# Patient Record
Sex: Female | Born: 1980 | Race: Black or African American | Hispanic: No | Marital: Single | Smoking: Never smoker
Health system: Southern US, Community
[De-identification: ages and names within clinical notes are randomized; demographics above are authoritative.]

## PROBLEM LIST (undated history)

## (undated) DIAGNOSIS — R7303 Prediabetes: Secondary | ICD-10-CM

## (undated) DIAGNOSIS — F319 Bipolar disorder, unspecified: Secondary | ICD-10-CM

## (undated) DIAGNOSIS — I10 Essential (primary) hypertension: Secondary | ICD-10-CM

## (undated) DIAGNOSIS — E119 Type 2 diabetes mellitus without complications: Secondary | ICD-10-CM

## (undated) DIAGNOSIS — F209 Schizophrenia, unspecified: Secondary | ICD-10-CM

---

## 2005-05-09 ENCOUNTER — Inpatient Hospital Stay (HOSPITAL_COMMUNITY): Admission: AD | Admit: 2005-05-09 | Discharge: 2005-05-14 | Payer: Self-pay | Admitting: *Deleted

## 2005-05-09 ENCOUNTER — Ambulatory Visit: Payer: Self-pay | Admitting: Obstetrics and Gynecology

## 2005-05-23 ENCOUNTER — Ambulatory Visit: Payer: Self-pay | Admitting: *Deleted

## 2005-05-30 ENCOUNTER — Ambulatory Visit: Payer: Self-pay | Admitting: *Deleted

## 2005-06-07 ENCOUNTER — Ambulatory Visit: Payer: Self-pay | Admitting: Obstetrics & Gynecology

## 2005-06-08 ENCOUNTER — Ambulatory Visit: Payer: Self-pay | Admitting: Obstetrics & Gynecology

## 2005-06-14 ENCOUNTER — Ambulatory Visit: Payer: Self-pay | Admitting: Obstetrics & Gynecology

## 2005-06-21 ENCOUNTER — Ambulatory Visit: Payer: Self-pay | Admitting: *Deleted

## 2005-06-21 ENCOUNTER — Ambulatory Visit (HOSPITAL_COMMUNITY): Admission: RE | Admit: 2005-06-21 | Discharge: 2005-06-21 | Payer: Self-pay | Admitting: *Deleted

## 2005-07-05 ENCOUNTER — Ambulatory Visit: Payer: Self-pay | Admitting: Obstetrics & Gynecology

## 2005-07-19 ENCOUNTER — Ambulatory Visit: Payer: Self-pay | Admitting: Obstetrics & Gynecology

## 2005-07-26 ENCOUNTER — Ambulatory Visit: Payer: Self-pay | Admitting: *Deleted

## 2005-07-28 ENCOUNTER — Ambulatory Visit: Payer: Self-pay | Admitting: *Deleted

## 2005-08-02 ENCOUNTER — Ambulatory Visit (HOSPITAL_COMMUNITY): Admission: RE | Admit: 2005-08-02 | Discharge: 2005-08-02 | Payer: Self-pay | Admitting: *Deleted

## 2005-08-02 ENCOUNTER — Ambulatory Visit: Payer: Self-pay | Admitting: Obstetrics & Gynecology

## 2005-08-04 ENCOUNTER — Ambulatory Visit: Payer: Self-pay | Admitting: *Deleted

## 2005-08-09 ENCOUNTER — Ambulatory Visit: Payer: Self-pay | Admitting: *Deleted

## 2005-08-09 ENCOUNTER — Ambulatory Visit (HOSPITAL_COMMUNITY): Admission: RE | Admit: 2005-08-09 | Discharge: 2005-08-09 | Payer: Self-pay | Admitting: *Deleted

## 2005-08-14 ENCOUNTER — Ambulatory Visit: Payer: Self-pay | Admitting: *Deleted

## 2005-08-14 ENCOUNTER — Inpatient Hospital Stay (HOSPITAL_COMMUNITY): Admission: AD | Admit: 2005-08-14 | Discharge: 2005-08-18 | Payer: Self-pay | Admitting: Obstetrics & Gynecology

## 2005-08-15 ENCOUNTER — Encounter (INDEPENDENT_AMBULATORY_CARE_PROVIDER_SITE_OTHER): Payer: Self-pay | Admitting: Specialist

## 2006-06-25 IMAGING — US US FETAL BPP W/O NONSTRESS
1 series · 14 of 17 positions shown · non-contrast
Comparison: none

CLINICAL DATA: BPP, assigned gestational age is 39 weeks 3 days.

[Series 1: us fetal bpp w/o nonstress · 0.39mm/px · 17 acquisitions, 14 frames shown]
[im 1/17]
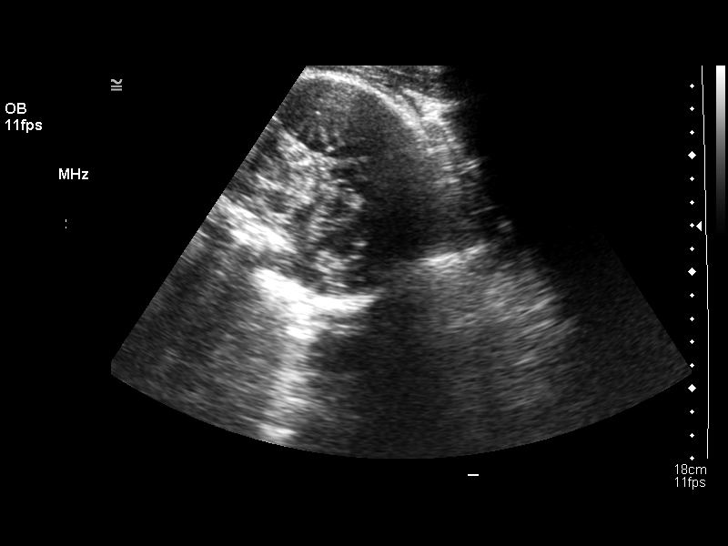
[im 2/17]
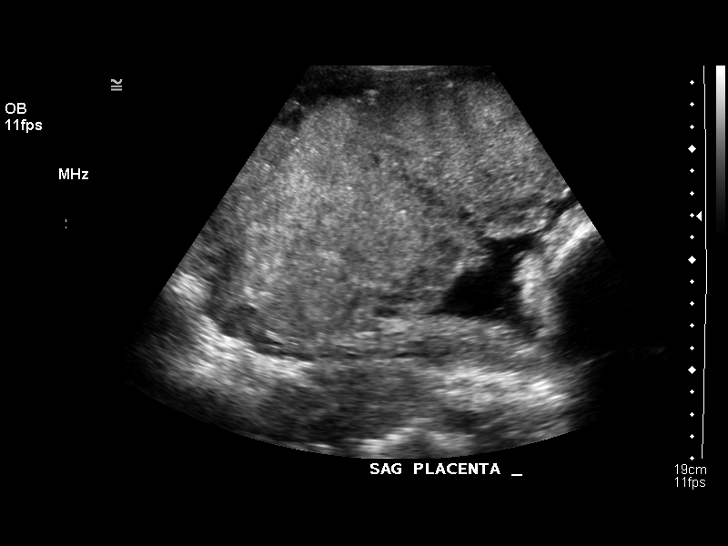
[im 4/17]
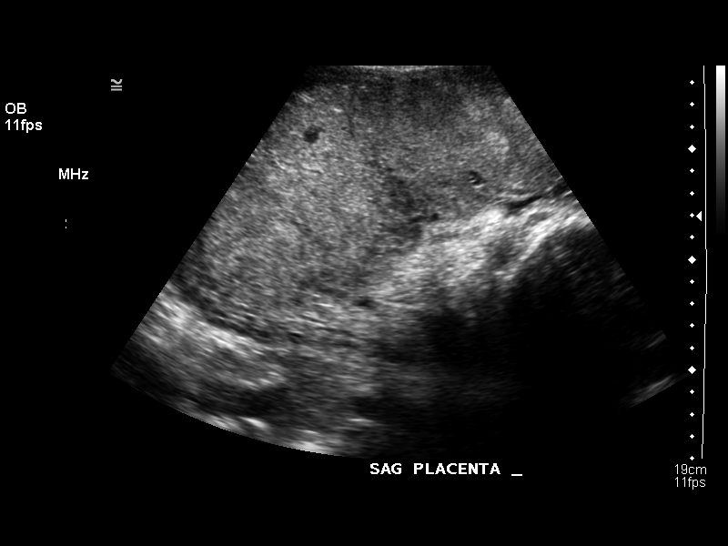
[im 5/17]
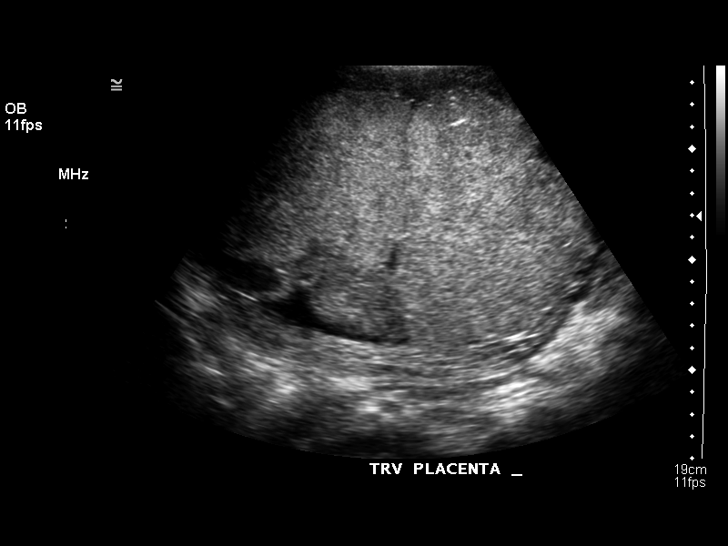
[im 6/17]
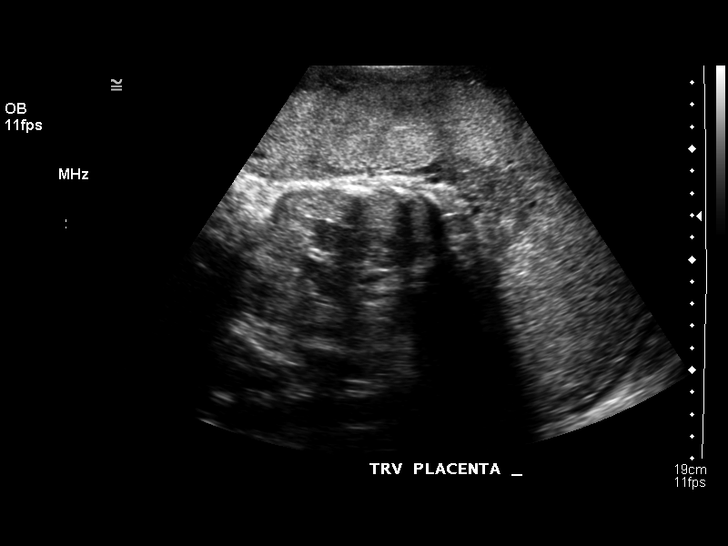
[im 7/17]
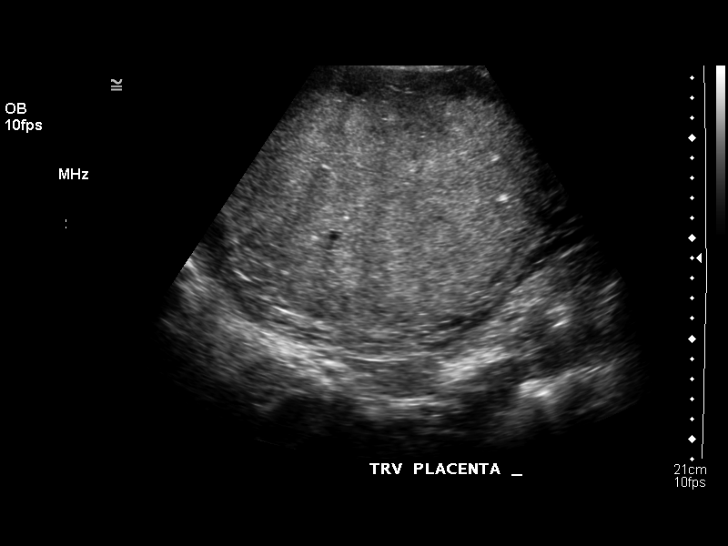
[im 8/17]
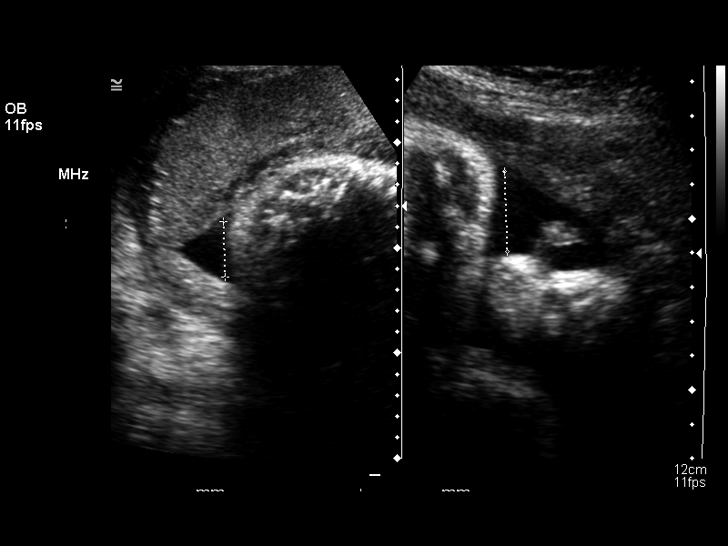
[im 10/17]
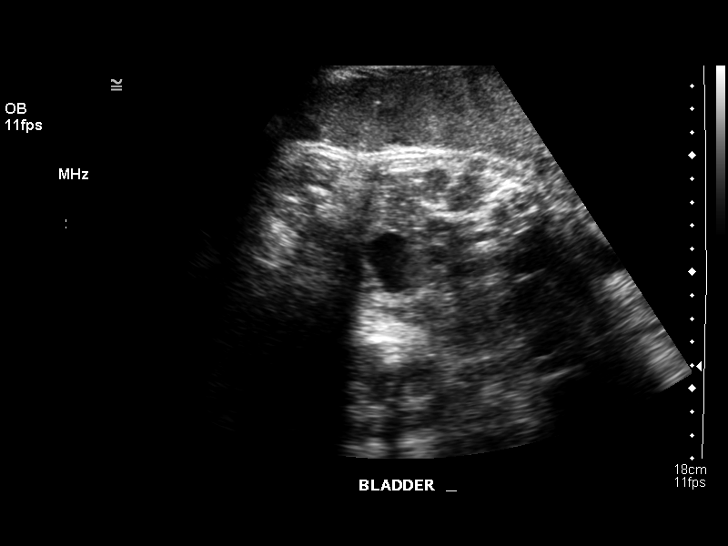
[im 11/17]
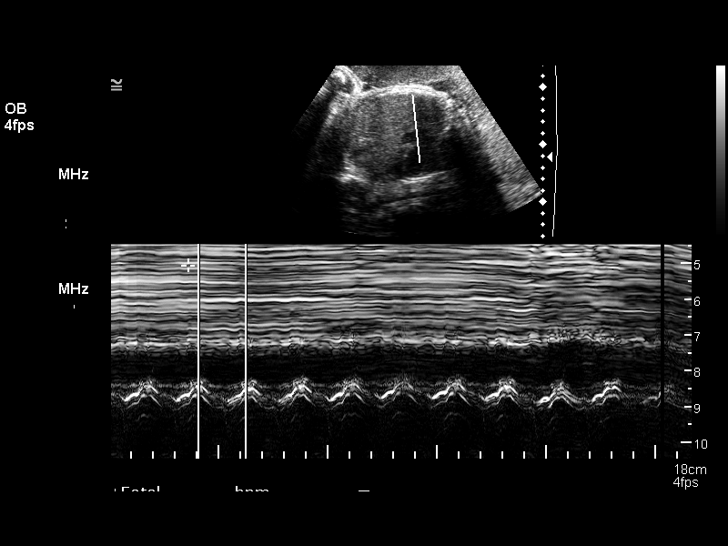
[im 12/17]
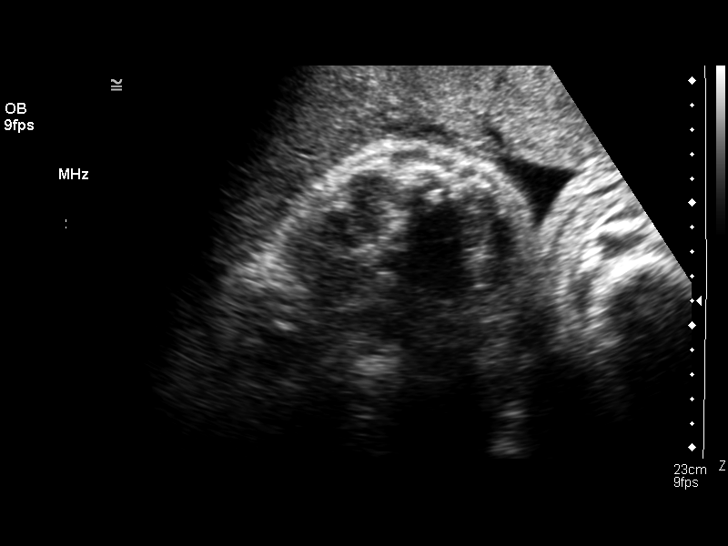
[im 13/17]
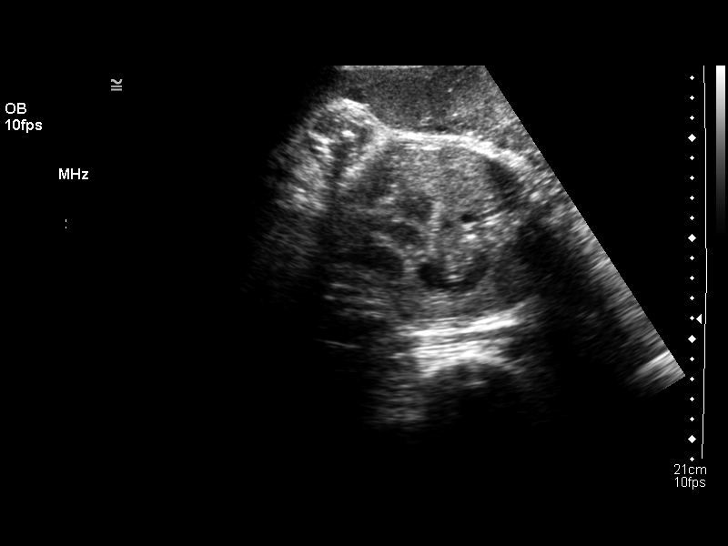
[im 14/17]
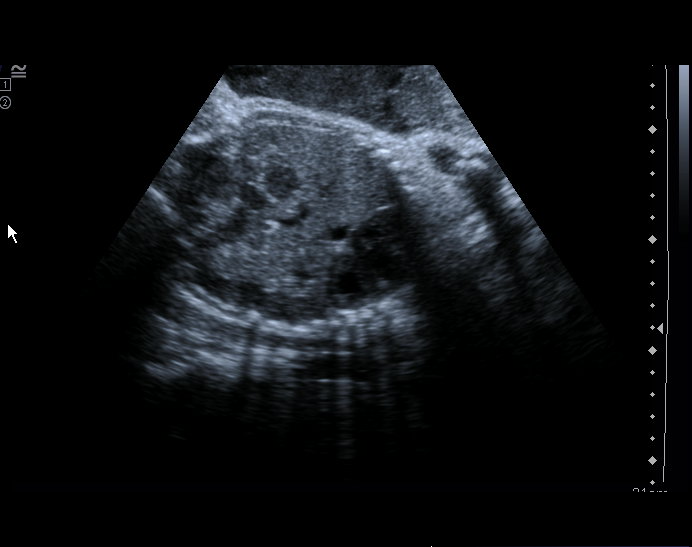
[im 16/17]
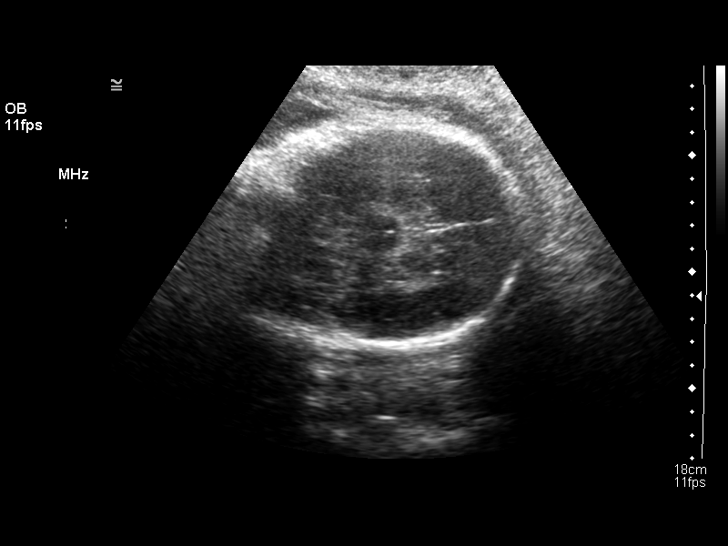
[im 17/17]
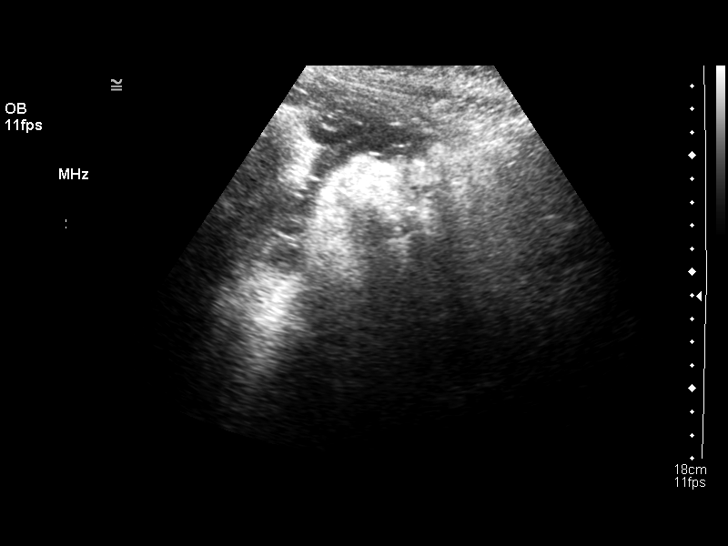

[14 of 17 positions shown; findings below may reference images not displayed]

BIOPHYSICAL PROFILE

 Number of Fetuses:  1
 Heart rate:  139
 Movement:  Yes
 Breathing:  Yes
 Presentation:  Cephalic
 Placental Location:  Anterior
 Grade:  II
 Previa:  No
 Amniotic Fluid (Subjective):  Normal
 Amniotic Fluid (Objective):  12.9 cm AFI (5th -95th%ile = 7.2 ? 22.6 cm for 39 wks)

 Fetal measurements and complete anatomic evaluation were not requested.  The following fetal anatomy was visualized on this exam:  Lateral ventricles, stomach, kidneys and bladder.  

 BPP SCORING
 Movements:  2  Time:  15 minutes
 Breathing:  2
 Tone:  2
 Amniotic Fluid:  2
 Total Score:  8

 MATERNAL UTERINE AND ADNEXAL FINDINGS
 Cervix:  Not evaluated
IMPRESSION: The biophysical score is [DATE] over a 15 minute period.  There is a single living intrauterine gestation in cephalic presentation.

## 2009-12-23 ENCOUNTER — Ambulatory Visit: Payer: Self-pay | Admitting: Internal Medicine

## 2011-04-21 NOTE — Discharge Summary (Signed)
Kristen [name], CRAVEY NO.:  [id number]   MEDICAL RECORD NO.:  [id number]          PATIENT TYPE:  INP   LOCATION:  9149                          [facility]ACILITY:  [facility]   PHYSICIAN:  Conni Elliot, M.D.DATE OF BIRTH:  05-23-1981   DATE OF ADMISSION:  05/09/2005  DATE OF DISCHARGE:  05/14/2005                                 DISCHARGE SUMMARY   ADMISSION DIAGNOSES:  1.  Intrauterine pregnancy 26-5/7 weeks.  2.  ___________ urinary tract infection.  3.  Viral illness.   DISCHARGE DIAGNOSES:  Probable viral illness at 27-4/7 weeks.   HISTORY:  This is a 30 year old, primigravida presenting at 26-5/7 weeks  based on LMP and confirmatory ultrasound.  The patient presented with fever  and chills of one day duration as well as a headache.  She had no  contractions. Membranes intact.  No vaginal bleeding, good fetal activity.  She did complain of a cottage cheese-like vaginal discharge with occasional  itching.  She had no dysuria or other urinary symptoms other than suprapubic  discomfort for a couple of weeks.  She did have a loose cough for two days.  No shortness of breath or chest pain.  Also family members had URI.  She had  a mild headache and had vomited once.  She had had no prenatal care and no  known pregnancy complications.  She had taken Tylenol prior to arrival and  had no allergies.   OB HISTORY:  Gravida 1.   GYN HISTORY:  History of Chlamydia in 2000.   PAST MEDICAL HISTORY:  Noncontributory.   PAST SURGICAL HISTORY:  None.   FAMILY HISTORY:  Positive for diabetes and hypertension.   SOCIAL HISTORY:  Negative tobacco, alcohol, or drugs.   PRENATAL LABS:  Obtained during hospitalization.  Hemoglobin 11.3 and stable  at the time of discharge.  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  HIV negative.  Hepatitis B negative.  Urine drug screen was negative.  Urinalysis was significant for trace  leukocyte esterase,  two epithelials and many bacteria.  Urine culture  subsequently came back negative as well as blood cultures came back  negative.  Vaginal GBS was positive.  GC negative.  Chlamydia negative.  RPR  nonreactive.  Rubella immune.  She had an ANA due to platelet count of 94  which may have been spurious as the repeat value was over 200.  She had an  ultrasound which showed symmetric growth, grade 2 placenta, breech  presentation, normal amniotic fluid, and cervix measuring 3.9.  Normal  anatomy.   ADMISSION PHYSICAL EXAMINATION:  GENERAL:  The patient looked ill and was  coughing.  VITAL SIGNS: Temperature 99.6, pulse 125, blood pressure 140/68.  Repeat  temperature was 101.8.  HEART:  RRR.  LUNGS:  CTA.  ABDOMEN:  Soft, nontender, consistent with 26-week size.  Negative CVA  tenderness.  EXTREMITIES:  Without edema.  HEENT:  Throat was clear.  CERVIX:  On speculum exam, there was scant white discharge.  On digital  exam, mid to anterior, long, closed, high.  Fetal heart  rate was in the  170's and nonreactive initially. Moderate variability.   Urinalysis was noted above.  White count was 13.4 with a left shift.  Wet  prep was negative.   HOSPITAL COURSE:  The patient was admitted for further evaluation and was  given oxygen mask and Tylenol p.r.n. as well as an IV fluid bolus and was  begun on Rocephin for possible ascending UTI.  She continued to have some  fever spikes and in fact remained febrile up to about 48 hours.  She also  had a cough which precipitated having a TB skin done which the patient  declined.  Zithromax was added to her antibiotic regimen.  Chest x-ray was  done which was negative.  PPD was done which was later negative.  Blood  cultures as above negative.  On hospital day #5, she had no complaints and  her cough was improved. She had been afebrile for 24 hours.  At this time  her platelets were noted to be 94,000 and she had an ANA done which was  subsequently  positive.  However, the repeat platelet count was well within  normal limits.  She was felt to have a resolving viral illness with her  condition improved and was discharged home on Zithromax. She was to take her  temperature at home and follow up in clinic in one week.  She was given a  prescription for prenatal vitamins one a day.  She was to take two more days  of Zithromax 500 mg p.o.  In addition, the patient declined one-hour Glucola  which would be done at clinic on the following week.      Kristen [name], C.N.M.    ______________________________  Conni Elliot, M.D.    DP/MEDQ  D:  07/18/2005  T:  07/19/2005  Job:  [id number]

## 2011-04-21 NOTE — Op Note (Signed)
NAMESYRENA, BURGES              ACCOUNT NO.:  [id number]   MEDICAL RECORD NO.:  [id number]          PATIENT TYPE:  INP   LOCATION:  9114                          [facility]ILITY:  [facility]   PHYSICIAN:  Conni Elliot, M.D.DATE OF BIRTH:  06-Jan-1981   DATE OF PROCEDURE:  08/15/2005  DATE OF DISCHARGE:                                 OPERATIVE REPORT   PREOPERATIVE DIAGNOSIS:  Repetitive severe variable fetal heart rate  decelerations following spontaneous rupture of membranes and probable occult  cord prolapse.   POSTOPERATIVE DIAGNOSIS:  Repetitive severe variable fetal heart rate  decelerations following spontaneous rupture of membranes and probable occult  cord prolapse.   PROCEDURE:  Low transverse cesarean section in urgent fashion.   SURGEON:  Conni Elliot, M.D.   ANESTHESIA:  Spinal.   FINDINGS:  Cord gas pH 7.01, pCO2 80.   ESTIMATED BLOOD LOSS:  800 mL.   DESCRIPTION OF PROCEDURE:  After placing the patient under spinal  anesthetic, the patient was supine with left lateral tilt position.  Abdomen  was prepped in the sterile fashion.  A low transverse incision was made  through the skin, subcutaneous tissue, and fascia.  The rectus muscle  separated in the midline.  The bladder flap created.  A low transverse  uterine incision was made and the baby was delivered in the vertex  presentation.  There was fresh meconium.  The baby was DeLee suctioned prior  to delivery of the chest.  The cord was doubly clamped and cut and the baby  handed to the neonatologist in attendance.  The placenta was delivered  spontaneously.  The uterus, bladder flap, anterior peritoneum, fascia,  subcutaneous tissue, and skin were closed in routine fashion.  Estimated  blood loss was approximately 800 mL.           ______________________________  Conni Elliot, M.D.     ASG/MEDQ  D:  08/15/2005  T:  08/16/2005  Job:  [id number]

## 2011-04-21 NOTE — Discharge Summary (Signed)
NAMETRENISHA, LAFAVOR              ACCOUNT NO.:  [id number]   MEDICAL RECORD NO.:  [id number]           PATIENT TYPE:   LOCATION:                                 FACILITY:   PHYSICIAN:  Mitsy Owen                DATE OF BIRTH:  [birth year]   DATE OF ADMISSION:  08/14/2005  DATE OF DISCHARGE:  08/18/2005                                 DISCHARGE SUMMARY   ADMISSION DIAGNOSES:  Intrauterine pregnancy at 40-1/7 weeks admitted for  induction of labor.   DISCHARGE DIAGNOSES:  1.  Status post lower transverse cesarean section.  2.  A 6 pound 10 ounce viable female.   DISCHARGE MEDICATIONS:  1.  Percocet 5/325 p.o. q.4h. as needed for pain.  2.  Prenatal vitamins.  3.  Iron sulfate.   BRIEF HISTORY AND PHYSICAL:  Patient is a 30 year old gravida 1 at 40-1/[redacted]  weeks gestation who presented for induction of labor secondary to  questionable chronic hypertension with concern for pregnancy induced  hypertension.   HOSPITAL COURSE:  Patient was admitted to labor and delivery.  PIH  laboratories were drawn which were all within normal limits.  Induction was  started with Cervidil.  Penicillin was started for the patient's GBS  positive status.  During the induction fetal heart rate began showing severe  repetitive variable decelerations following spontaneous rupture of  membranes.  As a result, patient was taken for a cesarean section which was  done on September 12.  There was felt to be probable occult cord prolapse.  Delivery during cesarean section was uncomplicated.  Apgars were 7 at one  minute and 8 at five minutes.  Cord gas pH was 7.01.  Postoperative course  was uncomplicated.  Blood type A+, rubella immune.  Postoperative hemoglobin  on September 13 was 12.  Mom is bottle feeding.  A Depo shot was given prior  to discharge for contraception.  Staples were removed prior to discharge.   CONDITION ON DISCHARGE:  Stable.   DISCHARGE INSTRUCTIONS:  Patient is to follow up at [facility] in six  weeks.  Patient was instructed to avoid heavy lifting for the next couple of  weeks.  She is also advised nothing per vagina for six weeks.      Benn Moulder, M.D.    ______________________________  Larina Lieurance   MR/MEDQ  D:  08/18/2005  T:  08/18/2005  Job:  [id number]

## 2011-08-14 ENCOUNTER — Emergency Department (HOSPITAL_COMMUNITY)
Admission: EM | Admit: 2011-08-14 | Discharge: 2011-08-16 | Disposition: A | Discharge status: Other Acute Inpatient Hospital | Payer: Self-pay | Attending: Emergency Medicine | Admitting: Emergency Medicine

## 2011-08-14 DIAGNOSIS — F411 Generalized anxiety disorder: Secondary | ICD-10-CM | POA: Insufficient documentation

## 2011-08-14 DIAGNOSIS — IMO0002 Reserved for concepts with insufficient information to code with codable children: Secondary | ICD-10-CM | POA: Insufficient documentation

## 2011-08-14 DIAGNOSIS — I498 Other specified cardiac arrhythmias: Secondary | ICD-10-CM | POA: Insufficient documentation

## 2011-08-14 DIAGNOSIS — F29 Unspecified psychosis not due to a substance or known physiological condition: Secondary | ICD-10-CM | POA: Insufficient documentation

## 2011-08-14 DIAGNOSIS — I10 Essential (primary) hypertension: Secondary | ICD-10-CM | POA: Insufficient documentation

## 2011-08-14 LAB — CBC
MCH: 26.1 pg (ref 26.0–34.0)
Platelets: 328 10*3/uL (ref 150–400)
RBC: 4.36 MIL/uL (ref 3.87–5.11)
RDW: 13.6 % (ref 11.5–15.5)

## 2011-08-14 LAB — ACETAMINOPHEN LEVEL: Acetaminophen (Tylenol), Serum: <15 ug/mL (ref 10–30)

## 2011-08-14 LAB — DIFFERENTIAL
Eosinophils Absolute: 0 10*3/uL (ref 0.0–0.7)
Eosinophils Relative: 0 % (ref 0–5)
Monocytes Absolute: 0.7 10*3/uL (ref 0.1–1.0)
Monocytes Relative: 7 % (ref 3–12)
Neutrophils Relative %: 73 % (ref 43–77)

## 2011-08-14 LAB — COMPREHENSIVE METABOLIC PANEL
ALT: 22 U/L (ref 0–35)
BUN: 11 mg/dL (ref 6–23)
CO2: 22 mEq/L (ref 19–32)
Chloride: 100 mEq/L (ref 96–112)
Creatinine, Ser: 0.83 mg/dL (ref 0.50–1.10)
GFR calc Af Amer: >60 mL/min (ref >60)
GFR calc non Af Amer: >60 mL/min (ref >60)
Sodium: 135 mEq/L (ref 135–145)
Total Bilirubin: 0.9 mg/dL (ref 0.3–1.2)

## 2011-08-15 ENCOUNTER — Emergency Department (HOSPITAL_COMMUNITY): Payer: Self-pay

## 2011-08-15 LAB — POCT I-STAT TROPONIN I: Troponin i, poc: 0.02 ng/mL (ref 0.00–0.08)

## 2011-08-15 LAB — POCT PREGNANCY, URINE: Preg Test, Ur: NEGATIVE

## 2011-08-15 LAB — URINALYSIS, ROUTINE W REFLEX MICROSCOPIC
Glucose, UA: NEGATIVE mg/dL
Ketones, ur: 15 mg/dL — AB
Leukocytes, UA: NEGATIVE
Urobilinogen, UA: 0.2 mg/dL (ref 0.0–1.0)

## 2011-08-15 LAB — URINE MICROSCOPIC-ADD ON

## 2011-08-15 LAB — BASIC METABOLIC PANEL
BUN: 6 mg/dL (ref 6–23)
Calcium: 9.1 mg/dL (ref 8.4–10.5)
Chloride: 106 mEq/L (ref 96–112)
Creatinine, Ser: 0.55 mg/dL (ref 0.50–1.10)
Potassium: 3.7 mEq/L (ref 3.5–5.1)
Sodium: 139 mEq/L (ref 135–145)

## 2011-08-15 LAB — RAPID URINE DRUG SCREEN, HOSP PERFORMED
Amphetamines: NOT DETECTED
Barbiturates: NOT DETECTED
Benzodiazepines: NOT DETECTED
Opiates: NOT DETECTED

## 2011-08-16 ENCOUNTER — Inpatient Hospital Stay (HOSPITAL_COMMUNITY)
Admission: AD | Admit: 2011-08-16 | Discharge: 2011-09-02 | DRG: 885 | Disposition: A | Discharge status: Home / Self Care | Payer: PRIVATE HEALTH INSURANCE | Attending: Psychiatry | Admitting: Psychiatry

## 2011-08-16 DIAGNOSIS — F329 Major depressive disorder, single episode, unspecified: Secondary | ICD-10-CM

## 2011-08-16 DIAGNOSIS — F411 Generalized anxiety disorder: Secondary | ICD-10-CM

## 2011-08-16 DIAGNOSIS — I498 Other specified cardiac arrhythmias: Secondary | ICD-10-CM

## 2011-08-16 DIAGNOSIS — Z79899 Other long term (current) drug therapy: Secondary | ICD-10-CM

## 2011-08-16 DIAGNOSIS — F3289 Other specified depressive episodes: Secondary | ICD-10-CM

## 2011-08-16 DIAGNOSIS — T43505A Adverse effect of unspecified antipsychotics and neuroleptics, initial encounter: Secondary | ICD-10-CM

## 2011-08-16 DIAGNOSIS — F333 Major depressive disorder, recurrent, severe with psychotic symptoms: Secondary | ICD-10-CM

## 2011-08-16 DIAGNOSIS — F29 Unspecified psychosis not due to a substance or known physiological condition: Principal | ICD-10-CM

## 2011-08-20 LAB — GLUCOSE, CAPILLARY: Glucose-Capillary: 106 mg/dL — ABNORMAL HIGH (ref 70–99)

## 2011-08-20 LAB — TSH: TSH: 1.588 u[IU]/mL (ref 0.350–4.500)

## 2011-08-21 LAB — GLUCOSE, CAPILLARY

## 2011-08-22 LAB — GLUCOSE, CAPILLARY: Glucose-Capillary: 106 mg/dL — ABNORMAL HIGH (ref 70–99)

## 2011-08-22 LAB — PREGNANCY, URINE: Preg Test, Ur: NEGATIVE

## 2011-08-23 LAB — GLUCOSE, CAPILLARY

## 2011-08-30 NOTE — Assessment & Plan Note (Signed)
[name], [name] NO.:  [id number]  MEDICAL RECORD NO.:  [id number]  LOCATION:  0404                          [facility]ILITY:  [facility]  PHYSICIAN:  Eulogio Ditch, MD DATE OF BIRTH:  1981/09/04  DATE OF ADMISSION:  08/15/2011 DATE OF DISCHARGE:                      PSYCHIATRIC ADMISSION ASSESSMENT   IDENTIFYING INFORMATION:  This is a 30 year old female who was admitted on an involuntary basis on August 15, 2011.  HISTORY OF PRESENT ILLNESS:  The patient presents on petition with papers stating the patient is disorganized, internally preoccupied, guarded and needing stabilization.  It also stated in her petition that the patient has been hallucinating for about a month, abandoned her 59- year-old child, ran into the street screaming, combative and fighting with police, complaining about hearing voices and refusing to answer questions, stating that she wanted to kill everyone and that she is "a spiritual retard."  When she was brought into the emergency room, the patient was complaining of coughing and shortness of breath.  PAST PSYCHIATRIC HISTORY:  [facility] No past or current psychiatric treatment.  SOCIAL HISTORY:  The patient is a 30 year old single female.  She is employed at [employer].  She has a 10-year-old child, and she lives in [place].  FAMILY HISTORY:  Unknown.  ALCOHOL AND DRUG HISTORY:  No apparent alcohol or substance use.  PRIMARY CARE PROVIDER:  Listed as none.  MEDICAL PROBLEMS:  The patient denies any acute or chronic health issues.  MEDICATIONS:  None.  DRUG ALLERGIES:  No known allergies.  PHYSICAL EXAM:  This is a normally-developed female who was assessed in the emergency department.  Her acetaminophen level is less than 15, salicylate level less than 2.  Her potassium is 3, blood sugar 149. Alcohol level less than 11.  Urine pregnancy test is negative.  Urine drug screen is  negative.  Urinalysis shows 0-2 WBCs.  Hemoglobin 11.4, hematocrit 33.  Her chest x-ray shows mild chronic peribronchial thickening.  Lungs otherwise grossly clear.  The patient was also reporting feeling afraid of the color blue.  She arrived in handcuffs, and the patient had to be placed in 4-point restraints.  Her physical exam was reviewed with no significant findings.  She was, however, agitated, anxious, argumentative and combative.  She received Geodon 20 mg and Ativan 3 for emergency sedation.  At one point in time, she became bradycardic and unresponsive and received atropine.  MENTAL STATUS EXAM:  The patient was in her room dressed in scrubs with poor eye contact.  Her speech is soft-spoken.  She offers little.  She seems very guarded but does answer brief questions coherently.  She is asking to go home.  Axis I:  Psychosis not otherwise specified. Axis II:  Deferred. Axis III:  No known medical conditions. Axis IV:  Deferred at this time. Axis V:  Current is 25-30.  PLAN:  We will have Haldol and Cogentin scheduled to lessen psychotic symptoms.  We will continue to gather more information and contact her family or support for concerns, identify her support group and continue to assess further comorbidities.  Her tentative length of stay at this time is 5-7 days or more.  Landry Corporal, N.P.   ______________________________ Eulogio Ditch, MD    JO/MEDQ  D:  08/16/2011  T:  08/16/2011  Job:  [id number]  Electronically Signed by Limmie PatriciaP. on 08/17/2011 09:27:35 AM Electronically Signed by Eulogio Ditch  on 08/30/2011 03:25:16 PM

## 2011-09-06 NOTE — Discharge Summary (Signed)
Kristen [name], HICKMON NO.:  [id number]  MEDICAL RECORD NO.:  [id number]  LOCATION:  0406                          [facility]ILITY[facility]  [facility]  PHYSICIAN:  Eulogio Ditch, MD DATE OF BIRTH:  10/21/1981  DATE OF ADMISSION:  08/16/2011 DATE OF DISCHARGE:  09/02/2011                              DISCHARGE SUMMARY   IDENTIFYING INFORMATION:  This is a 30 year old female.  This was an involuntary admission.  HISTORY OF PRESENT ILLNESS:  First [facility] admission for [facility], who presented in our emergency room accompanied by law enforcement who had found her in the middle of [employer].  She was uncooperative and combative at the time, and apparently had left her home and abandoned her 41-year-old child.  She was significantly agitated in the emergency room and unable to give a coherent history.  She repeatedly said that she hated the color blue.  She was treated with several doses of Ativan and ultimately Geodon 20 mg IM for stabilization and subsequently became bradycardic.  She did receive a cardiology consultation, was treated with oxygen and received atropine and stabilized without further incident.  She was transferred to [facility] for further evaluation and stabilization.  She has no previous history of treatment with psychotropics or previous psychiatric admissions and no evidence of substance abuse.  MEDICAL EVALUATION:  She was medically evaluated in our emergency room. Urine drug screen was negative for all substances.  CBC normal with a hemoglobin of 11.4, platelets of 328,000, alcohol screen negative. Salicylate screen negative.  Chemistry remarkable for a decreased potassium at 3.0, which was repleted with 10 mEq of IV potassium. Kidney function noted to be normal.  BUN 11, creatinine 0.83.  Liver enzymes mildly elevated.  She displayed no abnormal movements and displayed no focal neurologic findings.  COURSE OF HOSPITALIZATION:  She was admitted to  our acute stabilization unit and initially started on 2 mg of Haldol p.o. q.h.s. and Cogentin 1 mg daily.  She presented with guarded affect and manner, disorganized thinking and appeared internally preoccupied with a constricted affect. She was given a provisional diagnosis of psychosis NOS.  For the first week she displayed quite disorganized behavior with poor insight, appeared internally preoccupied, quite anxious, insisting that she wanted to leave.  Klonopin 1 mg q.h.s. was added to decrease anxiety. This alleviated her anxiety, but she continued to be quite guarded and suspicious, denying any suicidal thoughts or homicidal thoughts.  She was started on a multivitamin daily to address a very slight anemia. Haldol was increased to 1 mg q.a.m. and 5 mg q.h.s. to address issues with agitation, and she was started on Zoloft 50 mg q.a.m. for depression and Trileptal 300 mg q.a.m. and q.h.s. for mood stabilization.  She was restrained several times for aggressive behavior and attempts to elope from the unit.  We placed her in a private room due to being verbally aggressive with other patients.  By the 19th, her compliance with medications was spotty.  She had developed some thick-tongued speech and it was not clear if she was having EPS, so we elected to stop the Haldol, which at that point was at 10 mg q.h.s. and she was refusing  some doses.  We elected to start her on Prolixin 5 mg q.h.s., benztropine 1 mg q.h.s. and Cogentin 1 mg t.i.d. p.r.n. for any signs of dystonia.  We also elected to restrict her meals and activities to the unit due to her repeated efforts to elope.  She became very focused on the fact that she was pregnant, in spite of a negative pregnancy test, so a second pregnancy test was performed and it was also negative.  She was continuing to refuse medications and a second opinion to force medications was performed by Dr. Orson Aloe, who agreed that  forced medication was necessary.  Shalayna was then started on Prolixin 5 mg orally p.o. q.h.s. with an order to give 5 mg of Prolixin immediate release formulation at bedtime if she refused the p.o. dose.  She did require IM Prolixin several times, ultimately it was discontinued and she was switched to our Risperdal 2 mg p.o. q.a.m.  She was briefly on one-to-one observation, which was discontinued on September 25th.  By the 25th, she was able to participate productively in group therapy and for the first time do more than just insist on leaving.  She wanted to return to her apartment in [place], pack up and then relocate. She had talked about going to live with an aunt and other relatives in [place], and said that her daughter was also going to stay in [place] with relatives.  By the 27th, she demonstrated improved mood and judgment, expressed willingness to go to [facility] for medication management until she left the area and accepted information about follow-up programs.  Ultimately, she was returning to her own apartment and her cousin was willing to pick her up.  She has an aunt who lives locally who was on her way back from [place] who agreed to provide support.  By the 29th, she was in full contact with reality and stable for discharge.  She was taking medication as prescribed and was willing to continue it.  We gauged her suicide risk as minimal.  DISCHARGE/PLAN:  Follow up with [facility] on Tuesday, October 2nd at 9:00 a.m.  DISCHARGE DIAGNOSES:  Axis I:  Psychosis not otherwise specified. Axis II:  Deferred. Axis III:  History of bradycardia on IM Geodon, resolved. Axis IV:  Deferred. Axis V:  Current 52, past year not known.  Discharge Medications: Benztropine 1mg  q hs Clonazepam 1mg  q hs Risperidone 2mg  qam and qhs Sertraline 50mg  daily    Margaret A. Lorin Picket, N.P.   ______________________________ Eulogio Ditch,  MD    MAS/MEDQ  D:  09/04/2011  T:  09/04/2011  Job:  [id number]  Electronically Signed by Kari Baars N.P. on 09/05/2011 08:32:29 AM Electronically Signed by Eulogio Ditch  on 09/06/2011 12:23:58 PM

## 2012-12-25 ENCOUNTER — Encounter (HOSPITAL_COMMUNITY): Payer: Self-pay

## 2012-12-25 ENCOUNTER — Emergency Department (HOSPITAL_COMMUNITY)
Admission: EM | Admit: 2012-12-25 | Discharge: 2012-12-25 | Disposition: A | Discharge status: Home / Self Care | Payer: Medicaid Other | Attending: Emergency Medicine | Admitting: Emergency Medicine

## 2012-12-25 DIAGNOSIS — K029 Dental caries, unspecified: Secondary | ICD-10-CM | POA: Insufficient documentation

## 2012-12-25 DIAGNOSIS — Z79899 Other long term (current) drug therapy: Secondary | ICD-10-CM | POA: Insufficient documentation

## 2012-12-25 DIAGNOSIS — K0889 Other specified disorders of teeth and supporting structures: Secondary | ICD-10-CM

## 2012-12-25 DIAGNOSIS — K089 Disorder of teeth and supporting structures, unspecified: Secondary | ICD-10-CM | POA: Insufficient documentation

## 2012-12-25 MED ORDER — NAPROXEN 250 MG PO TABS: 250.0000 mg | ORAL_TABLET | Freq: Two times a day (BID) | ORAL | Status: DC

## 2012-12-25 MED ORDER — PENICILLIN V POTASSIUM 250 MG PO TABS: 250.0000 mg | ORAL_TABLET | Freq: Four times a day (QID) | ORAL | Status: DC

## 2012-12-25 MED ORDER — HYDROCODONE-ACETAMINOPHEN 5-325 MG PO TABS: ORAL_TABLET | ORAL | Status: DC

## 2012-12-25 MED ORDER — HYDROCODONE-ACETAMINOPHEN 5-325 MG PO TABS
2.0000 | ORAL_TABLET | Freq: Once | ORAL | Status: AC
  Administered 2012-12-25: 2 via ORAL
  Filled 2012-12-25: qty 2

## 2012-12-25 NOTE — ED Notes (Signed)
Lt. Lower toothache,. Mild swelling noted. Broke tooth.

## 2012-12-25 NOTE — ED Provider Notes (Signed)
History     CSN: [id number]  Arrival date & time 12/25/12  1549   First MD Initiated Contact with Patient 12/25/12 1756      Chief Complaint  Patient presents with  . Dental Pain     HPI Pt was seen at 1755.  Per pt, c/o gradual onset and persistence of constant left upper teeth "pain" for the past several days.  States she bit into some food and "felt a piece of tooth break."  Denies fevers, no intra-oral edema, no rash, no facial swelling, no dysphagia, no neck pain.   The condition is aggravated by nothing. The condition is relieved by nothing. The symptoms have been associated with no other complaints. The patient has no significant history of serious medical conditions.    History reviewed. No pertinent past medical history.  No past surgical history on file.   History  Substance Use Topics  . Smoking status: Never Smoker   . Smokeless tobacco: Not on file  . Alcohol Use: No      Review of Systems ROS: Statement: All systems negative except as marked or noted in the HPI; Constitutional: Negative for fever and chills. ; ; Eyes: Negative for eye pain and discharge. ; ; ENMT: Positive for dental caries, dental hygiene poor and toothache. Negative for ear pain, bleeding gums, dental injury, facial deformity, facial swelling, hoarseness, nasal congestion, sinus pressure, sore throat, throat swelling and tongue swollen. ; ; Cardiovascular: Negative for chest pain, palpitations, diaphoresis, dyspnea and peripheral edema. ; ; Respiratory: Negative for cough, wheezing and stridor. ; ; Gastrointestinal: Negative for nausea, vomiting, diarrhea and abdominal pain. ; ; Genitourinary: Negative for dysuria, flank pain and hematuria. ; ; Musculoskeletal: Negative for back pain and neck pain. ; ; Skin: Negative for rash and skin lesion. ; ; Neuro: Negative for headache, lightheadedness and neck stiffness. ;      Allergies  Review of patient's allergies indicates no known allergies.  Home  Medications   Current Outpatient Rx  Name  Route  Sig  Dispense  Refill  . ARIPIPRAZOLE 9.75 MG/1.3ML IM SOLN   Intramuscular   Inject 9.75 mg into the muscle every 30 (thirty) days.         . IBUPROFEN 200 MG PO TABS   Oral   Take 200 mg by mouth every 6 (six) hours as needed. For tooth pain         . LITHIUM CARBONATE 300 MG PO CAPS   Oral   Take 300 mg by mouth at bedtime.         Marland Kitchen MIRTAZAPINE 30 MG PO TABS   Oral   Take 30 mg by mouth at bedtime.         Marland Kitchen HYDROCODONE-ACETAMINOPHEN 5-325 MG PO TABS      1 or 2 tabs PO q6 hours prn pain   20 tablet   0   . NAPROXEN 250 MG PO TABS   Oral   Take 1 tablet (250 mg total) by mouth 2 (two) times daily with a meal.   14 tablet   0   . PENICILLIN V POTASSIUM 250 MG PO TABS   Oral   Take 1 tablet (250 mg total) by mouth 4 (four) times daily.   20 tablet   0     BP 156/84  Pulse 82  Temp 97.6 F (36.4 C)  Resp 18  SpO2 97%  LMP 12/18/2012  Physical Exam 1800: Physical examination: Vital signs and O2  SAT: Reviewed; Constitutional: Well developed, Well nourished, Well hydrated, In no acute distress; Head and Face: Normocephalic, Atraumatic; Eyes: EOMI, PERRL, No scleral icterus; ENMT: Mouth and pharynx normal, Poor dentition, Widespread dental decay, Left TM normal, Right TM normal, Mucous membranes moist, +upper left 1st and 2nd molars with dental decay.  No gingival erythema, edema, fluctuance, or drainage.  No intra-oral edema. No hoarse voice, no drooling, no stridor.  ; Neck: Supple, Full range of motion, No lymphadenopathy; Cardiovascular: Regular rate and rhythm, No murmur, rub, or gallop; Respiratory: Breath sounds clear & equal bilaterally, No rales, rhonchi, wheezes, or rub, Normal respiratory effort/excursion; Chest: Nontender, Movement normal; Extremities: Pulses normal, No tenderness, No edema; Neuro: AA&Ox3, Major CN grossly intact.  No gross focal motor or sensory deficits in extremities.; Skin: Color  normal, No rash, No petechiae, Warm, Dry    ED Course  Procedures     MDM  MDM Reviewed: nursing note and vitals     1805:  Pt encouraged to f/u with dentist or oral surgeon for her dental needs for good continuity of care and definitive treatment.  Verb understanding.         Laray Anger, DO 12/27/12 [phone]

## 2013-05-13 ENCOUNTER — Encounter (HOSPITAL_COMMUNITY): Payer: Self-pay

## 2013-05-13 ENCOUNTER — Emergency Department (HOSPITAL_COMMUNITY)
Admission: EM | Admit: 2013-05-13 | Discharge: 2013-05-13 | Disposition: A | Discharge status: Home / Self Care | Payer: Medicaid Other | Attending: Emergency Medicine | Admitting: Emergency Medicine

## 2013-05-13 DIAGNOSIS — L03012 Cellulitis of left finger: Secondary | ICD-10-CM

## 2013-05-13 DIAGNOSIS — L03019 Cellulitis of unspecified finger: Secondary | ICD-10-CM | POA: Insufficient documentation

## 2013-05-13 DIAGNOSIS — L0390 Cellulitis, unspecified: Secondary | ICD-10-CM

## 2013-05-13 DIAGNOSIS — M2550 Pain in unspecified joint: Secondary | ICD-10-CM | POA: Insufficient documentation

## 2013-05-13 DIAGNOSIS — R209 Unspecified disturbances of skin sensation: Secondary | ICD-10-CM | POA: Insufficient documentation

## 2013-05-13 DIAGNOSIS — Z79899 Other long term (current) drug therapy: Secondary | ICD-10-CM | POA: Insufficient documentation

## 2013-05-13 MED ORDER — SULFAMETHOXAZOLE-TRIMETHOPRIM 800-160 MG PO TABS: 1.0000 | ORAL_TABLET | Freq: Two times a day (BID) | ORAL | Status: DC

## 2013-05-13 MED ORDER — CEPHALEXIN 500 MG PO CAPS: 500.0000 mg | ORAL_CAPSULE | Freq: Four times a day (QID) | ORAL | Status: DC

## 2013-05-13 MED ORDER — HYDROCODONE-ACETAMINOPHEN 5-325 MG PO TABS: 1.0000 | ORAL_TABLET | Freq: Three times a day (TID) | ORAL | Status: DC | PRN

## 2013-05-13 NOTE — ED Provider Notes (Signed)
History    This chart was scribed for non-physician practitioner Raymon Mutton PA-C working with Hurman Horn, MD by Smitty Pluck, ED scribe. This patient was seen in room WTR5/WTR5 and the patient's care was started at 5:14 PM.   CSN: [id number]  Arrival date & time 05/13/13  1541   Chief Complaint  Patient presents with  . Hand Pain    left hand pointer finger  . Cellulitis    3-4 days     The history is provided by the patient and medical records. No language interpreter was used.   HPI Comments: Kristen [name] is a 32 y.o. female who presents to the Emergency Department complaining of constant, moderate left index finger pain and swelling onset 2-3 days ago. She reports that pain is throbbing. She reports that she awoke with the symptoms. She states that applying pressure to left index finger aggravates the pain. She mentions having numbness in left index finger. She states that her index finger has warmth and redness. Pt denies trauma to index finger, insect injury, abscess, lacerations to index finger, fever, chills, nausea, vomiting, diarrhea, weakness, cough, CP, SOB and any other pain. She states that she has not had manicure since 2 months ago.    History reviewed. No pertinent past medical history.  History reviewed. No pertinent past surgical history.  No family history on file.  History  Substance Use Topics  . Smoking status: Never Smoker   . Smokeless tobacco: Not on file  . Alcohol Use: No    OB History   Grav Para Term Preterm Abortions TAB SAB Ect Mult Living                  Review of Systems  Constitutional: Negative for fever and chills.  Respiratory: Negative for cough and shortness of breath.   Gastrointestinal: Negative for nausea, vomiting and diarrhea.  Musculoskeletal: Positive for arthralgias.  Neurological: Positive for numbness. Negative for weakness.  All other systems reviewed and are negative.    Allergies  Review of patient's  allergies indicates no known allergies.  Home Medications   Current Outpatient Rx  Name  Route  Sig  Dispense  Refill  . ARIPiprazole (ABILIFY) 9.75 MG/1.3ML injection   Intramuscular   Inject 9.75 mg into the muscle every 30 (thirty) days.         Marland Kitchen ibuprofen (ADVIL,MOTRIN) 200 MG tablet   Oral   Take 200 mg by mouth every 6 (six) hours as needed. For tooth pain         . lithium carbonate 300 MG capsule   Oral   Take 300 mg by mouth at bedtime.         . mirtazapine (REMERON) 30 MG tablet   Oral   Take 30 mg by mouth at bedtime.         . cephALEXin (KEFLEX) 500 MG capsule   Oral   Take 1 capsule (500 mg total) by mouth 4 (four) times daily.   40 capsule   0   . HYDROcodone-acetaminophen (NORCO) 5-325 MG per tablet   Oral   Take 1 tablet by mouth every 8 (eight) hours as needed for pain.   4 tablet   0   . sulfamethoxazole-trimethoprim (BACTRIM DS,SEPTRA DS) 800-160 MG per tablet   Oral   Take 1 tablet by mouth 2 (two) times daily. One po bid x 7 days   20 tablet   0     BP 130/55  Pulse 90  Temp(Src) 98.7 F (37.1 C) (Oral)  Resp 18  Ht 5\' 2"  (1.575 m)  Wt 240 lb (108.863 kg)  BMI 43.89 kg/m2  SpO2 99%  LMP 04/17/2013  Physical Exam  Nursing note and vitals reviewed. Constitutional: She is oriented to person, place, and time. She appears well-developed and well-nourished. No distress.  HENT:  Head: Normocephalic and atraumatic.  Eyes: EOM are normal.  Neck: Normal range of motion. Neck supple.  Cardiovascular: Normal rate, regular rhythm and normal heart sounds.   No murmur heard. Pulses:      Radial pulses are 2+ on the right side, and 2+ on the left side.       Dorsalis pedis pulses are 2+ on the right side, and 2+ on the left side.  Pulmonary/Chest: Effort normal and breath sounds normal. No respiratory distress. She has no wheezes. She has no rales.  Musculoskeletal: Normal range of motion. She exhibits tenderness. She exhibits no  edema.  Swelling with erythema and warmth to touch to the left index finger Pain upon palpation to the left index finger along nail bed and cuticle Pain upon palpation to the DIP and tip of left index finger   Decreased ROM to the DIP of the left index finger secondary to pain Full ROM to the left wrist, hand, and remaining fingers  Strength 5+/5+ to left hand and fingers   Neurological: She is alert and oriented to person, place, and time.  Sensation intact in bilateral upper and lower extremities and able to discriminate between sharp and dull touch - mild difficulty identifying touch on the tip of the left index finger  Skin: Skin is warm and dry. There is erythema.  Swelling note to the left index finger with erythema and warmth to touch Mild paleness noted to the tip of the left index finger  Psychiatric: She has a normal mood and affect. Her behavior is normal. Thought content normal.    ED Course  Procedures (including critical care time) DIAGNOSTIC STUDIES: Oxygen Saturation is 99% on room air, normal by my interpretation.    COORDINATION OF CARE: 5:22 PM Discussed ED treatment with pt and pt agrees.     Labs Reviewed - No data to display No results found.   1. Cellulitis   2. Paronychia, left       MDM  I personally performed the services described in this documentation, which was scribed in my presence. The recorded information has been reviewed and is accurate.   Suspicion for cellulitis, possible beginning of paronychia to the left index with erythema, pain upon palpation to the cuticle and tip of left index finger. Discussed case with Dr. Fonnie Jarvis - Dr. Jinger Neighbors assessed patient. US guided abscess performed by Dr. Jinger Neighbors - negative fluid accumulation. No drainage performed, no fluid noted. Patient afebrile, stable for discharge. Antibiotics and pain medications given for infection - cellulitis and possible beginnings of paronychia. discussed course,  precautions, and disposal technique with patient. Referred patient to Adult Care Clinic and/or ED for re-evaluation within 2-3 days. Discussed with patient to performed warm soaks at least 3-4 times a day for 20 minutes. Discussed with patient to monitor symptoms and if symptoms are to worsen or change to report back to the ED - strict return precautions given. Patient agreed to plan of care, understood, all questions answered.   Raymon Mutton, PA-C 05/14/13 1114

## 2013-05-14 NOTE — ED Provider Notes (Signed)
Medical screening examination/treatment/procedure(s) were conducted as a shared visit with non-physician practitioner(s) and myself.  I personally evaluated the patient during the encounter.  Mild erythema/warmth/tenderness affected fingertip cuticle and volar distal pad without fluctuance or subcut. fluid noted on limited bedside US, CR<2secs, DIP and PIP NT, no tenderness along tendon sheath regions, no diffuse finger swelling  Hurman Horn, MD 05/14/13 2128

## 2013-05-19 ENCOUNTER — Ambulatory Visit: Payer: Medicaid Other | Attending: Family Medicine | Admitting: Internal Medicine

## 2013-05-19 VITALS — BP 136/80 | HR 92 | Temp 98.2°F | Resp 15 | Ht 63.0 in | Wt 252.2 lb

## 2013-05-19 DIAGNOSIS — L03019 Cellulitis of unspecified finger: Secondary | ICD-10-CM | POA: Insufficient documentation

## 2013-05-19 DIAGNOSIS — L02512 Cutaneous abscess of left hand: Secondary | ICD-10-CM | POA: Insufficient documentation

## 2013-05-19 DIAGNOSIS — L02519 Cutaneous abscess of unspecified hand: Secondary | ICD-10-CM | POA: Insufficient documentation

## 2013-05-19 DIAGNOSIS — IMO0002 Reserved for concepts with insufficient information to code with codable children: Secondary | ICD-10-CM

## 2013-05-19 LAB — POCT GLYCOSYLATED HEMOGLOBIN (HGB A1C): Hemoglobin A1C: 5.9

## 2013-05-19 MED ORDER — SULFAMETHOXAZOLE-TRIMETHOPRIM 800-160 MG PO TABS: 1.0000 | ORAL_TABLET | Freq: Two times a day (BID) | ORAL | Status: DC

## 2013-05-19 NOTE — Progress Notes (Signed)
Patient here for hospital follow up Finger injury

## 2013-05-19 NOTE — Progress Notes (Signed)
Patient ID: Kristen [name], female   DOB: [birth year], 32 y.o.   MRN: [id number] Patient Demographics  Kristen [name], is a 32 y.o. female  WUJ:[id number]  [id number]  DOB - 01-Apr-1981  Chief Complaint  Patient presents with  . Hospitalization Follow-up        Subjective:   Kristen [name] with History of bipolar disorder  is here for followup on her left index finger infection/abscess, she was seen in the ER and is ago and placed on antibiotics he did her swelling has improved, she has no fever chills, pain has improved. Denies any subjective complaints except as above, no active headache, no chest abdominal pain at this time, not short of breath. No focal weakness which is new. **  Objective:    Patient Active Problem List   Diagnosis Date Noted  . Abscess of finger of left hand 05/19/2013     Filed Vitals:   05/19/13 1640  BP: 136/80  Pulse: 92  Temp: 98.2 F (36.8 C)  Resp: 15  Height: 5\' 3"  (1.6 m)  Weight: 252 lb 3.2 oz (114.397 kg)  SpO2: 100%     Exam  Awake Alert, Oriented X 3, No new F.N deficits, Normal affect Adams.AT,PERRAL Supple Neck,No JVD, No cervical lymphadenopathy appriciated.  Symmetrical Chest wall movement, Good air movement bilaterally, CTAB RRR,No Gallops,Rubs or new Murmurs, No Parasternal Heave +ve B.Sounds, Abd Soft, Non tender, No organomegaly appriciated, No rebound - guarding or rigidity. No Cyanosis, Clubbing or edema, No new Rash or bruise  Left index finger has a small pus point, swelling is much improved per patient.    Data Review   CBC No results found for this basename: WBC, HGB, HCT, PLT, MCV, MCH, MCHC, RDW, NEUTRABS, LYMPHSABS, MONOABS, EOSABS, BASOSABS, BANDABS, BANDSABD,  in the last 168 hours  Chemistries   No results found for this basename: NA, K, CL, CO2, GLUCOSE, BUN, CREATININE, GFRCGP, CALCIUM, MG, AST, ALT, ALKPHOS, BILITOT,  in the last 168  hours ------------------------------------------------------------------------------------------------------------------ No results found for this basename: HGBA1C,  in the last 72 hours ------------------------------------------------------------------------------------------------------------------ No results found for this basename: CHOL, HDL, LDLCALC, TRIG, CHOLHDL, LDLDIRECT,  in the last 72 hours ------------------------------------------------------------------------------------------------------------------ No results found for this basename: TSH, T4TOTAL, FREET3, T3FREE, THYROIDAB,  in the last 72 hours ------------------------------------------------------------------------------------------------------------------ No results found for this basename: VITAMINB12, FOLATE, FERRITIN, TIBC, IRON, RETICCTPCT,  in the last 72 hours  Coagulation profile  No results found for this basename: INR, PROTIME,  in the last 168 hours     Prior to Admission medications   Medication Sig Start Date End Date Taking? Authorizing Provider  ARIPiprazole (ABILIFY) 9.75 MG/1.3ML injection Inject 9.75 mg into the muscle every 30 (thirty) days.    Historical Provider, MD  cephALEXin (KEFLEX) 500 MG capsule Take 1 capsule (500 mg total) by mouth 4 (four) times daily. 05/13/13   Marissa Sciacca, PA-C  HYDROcodone-acetaminophen (NORCO) 5-325 MG per tablet Take 1 tablet by mouth every 8 (eight) hours as needed for pain. 05/13/13   Marissa Sciacca, PA-C  ibuprofen (ADVIL,MOTRIN) 200 MG tablet Take 200 mg by mouth every 6 (six) hours as needed. For tooth pain    Historical Provider, MD  lithium carbonate 300 MG capsule Take 300 mg by mouth at bedtime.    Historical Provider, MD  mirtazapine (REMERON) 30 MG tablet Take 30 mg by mouth at bedtime.    Historical Provider, MD  sulfamethoxazole-trimethoprim (BACTRIM DS,SEPTRA DS) 800-160 MG per tablet Take 1 tablet by mouth 2 (two)  times daily. One po bid x 7 days 05/19/13    Leroy Sea, MD     Assessment & Plan   Left index finger abscess. For now we will continue Bactrim for 7 more days, continue warm water compresses, ibuprofen when necessary, I have referred her to hand surgeon urgently. She is advised to follow with hand surgery, symptoms get worse to follow with the ER on urgent basis. I will check the A1c as patient had in her right eye a few months ago, question if she has occult diabetes, we'll see her back in a few weeks. Patient's urine pregnancy test is negative here.       Leroy Sea M.D on 05/19/2013 at 4:55 PM

## 2013-05-26 ENCOUNTER — Ambulatory Visit: Payer: PRIVATE HEALTH INSURANCE

## 2015-04-04 ENCOUNTER — Encounter (HOSPITAL_BASED_OUTPATIENT_CLINIC_OR_DEPARTMENT_OTHER): Payer: Self-pay | Admitting: *Deleted

## 2015-04-04 ENCOUNTER — Emergency Department (HOSPITAL_BASED_OUTPATIENT_CLINIC_OR_DEPARTMENT_OTHER)
Admission: EM | Admit: 2015-04-04 | Discharge: 2015-04-04 | Disposition: A | Discharge status: Home / Self Care | Payer: Medicaid Other | Attending: Emergency Medicine | Admitting: Emergency Medicine

## 2015-04-04 DIAGNOSIS — Z79899 Other long term (current) drug therapy: Secondary | ICD-10-CM | POA: Insufficient documentation

## 2015-04-04 DIAGNOSIS — Z792 Long term (current) use of antibiotics: Secondary | ICD-10-CM | POA: Insufficient documentation

## 2015-04-04 DIAGNOSIS — H5711 Ocular pain, right eye: Secondary | ICD-10-CM | POA: Diagnosis not present

## 2015-04-04 DIAGNOSIS — H169 Unspecified keratitis: Secondary | ICD-10-CM | POA: Insufficient documentation

## 2015-04-04 MED ORDER — MOXIFLOXACIN HCL 0.5 % OP SOLN: 1.0000 [drp] | Freq: Every day | OPHTHALMIC | Status: DC

## 2015-04-04 MED ORDER — FLUORESCEIN SODIUM 1 MG OP STRP
1.0000 | ORAL_STRIP | Freq: Once | OPHTHALMIC | Status: AC
  Administered 2015-04-04: 1 via OPHTHALMIC
  Filled 2015-04-04: qty 1

## 2015-04-04 MED ORDER — TETRACAINE HCL 0.5 % OP SOLN
1.0000 [drp] | Freq: Once | OPHTHALMIC | Status: AC
  Administered 2015-04-04: 1 [drp] via OPHTHALMIC
  Filled 2015-04-04: qty 2

## 2015-04-04 NOTE — ED Notes (Signed)
C/o Rt eye pain, sensitive to light

## 2015-04-04 NOTE — ED Notes (Signed)
MD at bedside. 

## 2015-04-04 NOTE — ED Notes (Signed)
Pt awaiting Ophthalmology Consult phone call to EDP, comfort measures provided

## 2015-04-04 NOTE — ED Provider Notes (Signed)
CSN: [id number]     Arrival date & time 04/04/15  0746 History   First MD Initiated Contact with Patient 04/04/15 [phone]     Chief Complaint  Patient presents with  . Eye Pain     (Consider location/radiation/quality/duration/timing/severity/associated sxs/prior Treatment) Patient is a 34 y.o. female presenting with eye problem.  Eye Problem Location:  R eye Quality:  Sharp Severity:  Severe Onset quality:  Gradual Duration:  24 hours Timing:  Constant Progression:  Worsening Chronicity:  New Context: contact lenses   Relieved by:  Nothing Worsened by:  Bright light Ineffective treatments: Restasis. Associated symptoms: blurred vision, foreign body sensation, photophobia and redness     History reviewed. No pertinent past medical history. Past Surgical History  Procedure Laterality Date  . Cesarean section     History reviewed. No pertinent family history. History  Substance Use Topics  . Smoking status: Never Smoker   . Smokeless tobacco: Not on file  . Alcohol Use: No   OB History    No data available     Review of Systems  Eyes: Positive for blurred vision, photophobia and redness.  All other systems reviewed and are negative.     Allergies  Review of patient's allergies indicates no known allergies.  Home Medications   Prior to Admission medications   Medication Sig Start Date End Date Taking? Authorizing Provider  ARIPiprazole (ABILIFY) 9.75 MG/1.3ML injection Inject 9.75 mg into the muscle every 30 (thirty) days.    Historical Provider, MD  cephALEXin (KEFLEX) 500 MG capsule Take 1 capsule (500 mg total) by mouth 4 (four) times daily. 05/13/13   Marissa Sciacca, PA-C  HYDROcodone-acetaminophen (NORCO) 5-325 MG per tablet Take 1 tablet by mouth every 8 (eight) hours as needed for pain. 05/13/13   Marissa Sciacca, PA-C  ibuprofen (ADVIL,MOTRIN) 200 MG tablet Take 200 mg by mouth every 6 (six) hours as needed. For tooth pain    Historical Provider, MD    lithium carbonate 300 MG capsule Take 300 mg by mouth at bedtime.    Historical Provider, MD  mirtazapine (REMERON) 30 MG tablet Take 30 mg by mouth at bedtime.    Historical Provider, MD  sulfamethoxazole-trimethoprim (BACTRIM DS,SEPTRA DS) 800-160 MG per tablet Take 1 tablet by mouth 2 (two) times daily. One po bid x 7 days 05/19/13   Leroy Sea, MD   BP 152/97 mmHg  Pulse 89  Temp(Src) 98.1 F (36.7 C) (Oral)  Resp 18  Ht  (1.575 m)  Wt 250 lb (113.399 kg)  BMI 45.71 kg/m2  SpO2 100%  LMP 03/08/2015 (Approximate) Physical Exam  Constitutional: She is oriented to person, place, and time. She appears well-developed and well-nourished. No distress.  HENT:  Head: Normocephalic and atraumatic.  Eyes: EOM are normal. Pupils are equal, round, and reactive to light. Right eye exhibits no chemosis. Left eye exhibits no chemosis. Right conjunctiva is injected. Left conjunctiva is not injected. No scleral icterus.  Slit lamp exam:      The right eye shows fluorescein uptake.    Neck: Neck supple.  Cardiovascular: Normal rate and intact distal pulses.   Pulmonary/Chest: Effort normal. No stridor. No respiratory distress.  Abdominal: Normal appearance. She exhibits no distension.  Neurological: She is alert and oriented to person, place, and time.  Skin: Skin is warm and dry. No rash noted.  Psychiatric: She has a normal mood and affect. Her behavior is normal.  Nursing note and vitals reviewed.   ED Course  Procedures (including critical care time) Labs Review Labs Reviewed - No data to display  Imaging Review No results found.   EKG Interpretation None      MDM   Final diagnoses:  Eye pain, right  Keratitis, right    34 yo female with 24 hours of eye pain in setting of overuse of contact lenses.  She has some corneal uptake of fluorescein.  I'm concerned about keratitis.  I spoke with Dr. Allena Katz (Opthalmology) who agrees to see pt in clinic first thing tomorrow  morning.  He recommended vigamox in the meantime.        Blake Divine, MD 04/04/15 [phone]

## 2015-04-04 NOTE — Discharge Instructions (Signed)
Follow up with Dr. Allena Katz tomorrow.  Do not replace your contact lens.  Do not patch your eye.  Return to the ED if you develop fevers, inability to see, facial swelling, or other concerning symptoms.

## 2015-04-04 NOTE — ED Notes (Signed)
C/o rt eye pain, wears contacts, states rt eye contact removed due to pain

## 2016-04-26 ENCOUNTER — Inpatient Hospital Stay: Admission: EM | Admit: 2016-04-26 | Payer: Self-pay | Source: Home / Self Care

## 2016-04-26 ENCOUNTER — Ambulatory Visit (HOSPITAL_COMMUNITY): Payer: Self-pay

## 2016-04-26 ENCOUNTER — Ambulatory Visit (HOSPITAL_COMMUNITY)
Admission: RE | Admit: 2016-04-26 | Discharge: 2016-04-26 | Disposition: A | Discharge status: Home / Self Care | Payer: Medicaid Other | Attending: Psychiatry | Admitting: Psychiatry

## 2016-04-26 ENCOUNTER — Encounter (HOSPITAL_COMMUNITY): Payer: Self-pay | Admitting: Emergency Medicine

## 2016-04-26 ENCOUNTER — Emergency Department (HOSPITAL_COMMUNITY)
Admission: EM | Admit: 2016-04-26 | Discharge: 2016-04-28 | Disposition: A | Discharge status: Other Acute Inpatient Hospital | Payer: Medicaid Other | Attending: Emergency Medicine | Admitting: Emergency Medicine

## 2016-04-26 DIAGNOSIS — F310 Bipolar disorder, current episode hypomanic: Secondary | ICD-10-CM

## 2016-04-26 DIAGNOSIS — F200 Paranoid schizophrenia: Secondary | ICD-10-CM | POA: Insufficient documentation

## 2016-04-26 DIAGNOSIS — F29 Unspecified psychosis not due to a substance or known physiological condition: Secondary | ICD-10-CM | POA: Diagnosis not present

## 2016-04-26 DIAGNOSIS — E876 Hypokalemia: Secondary | ICD-10-CM | POA: Diagnosis not present

## 2016-04-26 DIAGNOSIS — R4182 Altered mental status, unspecified: Secondary | ICD-10-CM | POA: Diagnosis present

## 2016-04-26 DIAGNOSIS — F6381 Intermittent explosive disorder: Secondary | ICD-10-CM | POA: Diagnosis present

## 2016-04-26 DIAGNOSIS — R442 Other hallucinations: Secondary | ICD-10-CM | POA: Diagnosis not present

## 2016-04-26 LAB — COMPREHENSIVE METABOLIC PANEL
ALBUMIN: 4.4 g/dL (ref 3.5–5.0)
ALK PHOS: 74 U/L (ref 38–126)
ALT: 17 U/L (ref 14–54)
AST: 24 U/L (ref 15–41)
Anion gap: 8 (ref 5–15)
BUN: 11 mg/dL (ref 6–20)
CALCIUM: 9.6 mg/dL (ref 8.9–10.3)
CO2: 24 mmol/L (ref 22–32)
CREATININE: 0.77 mg/dL (ref 0.44–1.00)
Chloride: 103 mmol/L (ref 101–111)
GFR calc Af Amer: >60 mL/min (ref >60)
GFR calc non Af Amer: >60 mL/min (ref >60)
GLUCOSE: 136 mg/dL — AB (ref 65–99)
Potassium: 2.9 mmol/L — ABNORMAL LOW (ref 3.5–5.1)
SODIUM: 135 mmol/L (ref 135–145)
Total Bilirubin: 1 mg/dL (ref 0.3–1.2)
Total Protein: 7.8 g/dL (ref 6.5–8.1)

## 2016-04-26 LAB — RAPID URINE DRUG SCREEN, HOSP PERFORMED
Amphetamines: NOT DETECTED
BARBITURATES: NOT DETECTED
Benzodiazepines: NOT DETECTED
Cocaine: NOT DETECTED
Opiates: NOT DETECTED
Tetrahydrocannabinol: NOT DETECTED

## 2016-04-26 LAB — CBC
HEMATOCRIT: 35 % — AB (ref 36.0–46.0)
HEMOGLOBIN: 12.2 g/dL (ref 12.0–15.0)
MCH: 25.5 pg — AB (ref 26.0–34.0)
MCHC: 34.9 g/dL (ref 30.0–36.0)
MCV: 73.2 fL — AB (ref 78.0–100.0)
Platelets: 401 10*3/uL — ABNORMAL HIGH (ref 150–400)
RBC: 4.78 MIL/uL (ref 3.87–5.11)
RDW: 14.6 % (ref 11.5–15.5)
WBC: 10.8 10*3/uL — ABNORMAL HIGH (ref 4.0–10.5)

## 2016-04-26 LAB — LITHIUM LEVEL

## 2016-04-26 LAB — ACETAMINOPHEN LEVEL: Acetaminophen (Tylenol), Serum: <10 ug/mL — ABNORMAL LOW (ref 10–30)

## 2016-04-26 LAB — SALICYLATE LEVEL: Salicylate Lvl: <4 mg/dL (ref 2.8–30.0)

## 2016-04-26 LAB — ETHANOL: Alcohol, Ethyl (B): <5 mg/dL (ref <5)

## 2016-04-26 MED ORDER — LORAZEPAM 2 MG/ML IJ SOLN
2.0000 mg | INTRAMUSCULAR | Status: DC | PRN
  Administered 2016-04-26: 2 mg via INTRAMUSCULAR
  Filled 2016-04-26: qty 1

## 2016-04-26 MED ORDER — MIRTAZAPINE 30 MG PO TABS: 30.0000 mg | ORAL_TABLET | Freq: Every day | ORAL | Status: DC

## 2016-04-26 MED ORDER — LITHIUM CARBONATE 300 MG PO CAPS: 300.0000 mg | ORAL_CAPSULE | Freq: Every day | ORAL | Status: DC

## 2016-04-26 MED ORDER — STERILE WATER FOR INJECTION IJ SOLN
INTRAMUSCULAR | Status: AC
  Filled 2016-04-26: qty 10

## 2016-04-26 MED ORDER — POTASSIUM CHLORIDE CRYS ER 20 MEQ PO TBCR
40.0000 meq | EXTENDED_RELEASE_TABLET | Freq: Once | ORAL | Status: AC
  Administered 2016-04-26: 40 meq via ORAL
  Filled 2016-04-26: qty 2

## 2016-04-26 MED ORDER — ZIPRASIDONE MESYLATE 20 MG IM SOLR
20.0000 mg | Freq: Two times a day (BID) | INTRAMUSCULAR | Status: DC | PRN
  Administered 2016-04-26: 20 mg via INTRAMUSCULAR
  Filled 2016-04-26: qty 20

## 2016-04-26 NOTE — BH Assessment (Addendum)
Tele Assessment Note   Kristen [name] is an 35 y.o. female. Pt brought in by EMS. Pt was not oriented. Pt would not answer the writer's questions. Pt was responding and communicating with internal stimuli. Writer was not able to obtain any information from the Pt. Writer unable to obtain collateral information.   Writer consulted with Renata Caprice, DNP. Per Renata Caprice, DNP Pt meets inpatient criteria. Recommends medical clearance. TTS to seek placement.    Diagnosis:  F20.0 Schizophrenia  Past Medical History: No past medical history on file.  Past Surgical History  Procedure Laterality Date  . Cesarean section      Family History: No family history on file.  Social History:  reports that she has never smoked. She does not have any smokeless tobacco history on file. She reports that she does not drink alcohol or use illicit drugs.  Additional Social History:     CIWA:   COWS:    PATIENT STRENGTHS: (choose at least two) Active sense of humor Communication skills  Allergies: No Known Allergies  Home Medications:  (Not in a hospital admission)  OB/GYN Status:  No LMP recorded.  General Assessment Data Location of Assessment: [facility] Assessment Services TTS Assessment: In system Is this a Tele or Face-to-Face Assessment?: Face-to-Face Is this an Initial Assessment or a Re-assessment for this encounter?: Initial Assessment Marital status: Single Maiden name: NA Is patient pregnant?: No Pregnancy Status: No Living Arrangements: Other (Comment) (homeless) Can pt return to current living arrangement?: Yes Admission Status: Voluntary Is patient capable of signing voluntary admission?: Yes Referral Source: Self/Family/Friend Insurance type: Medicaid     Crisis Care Plan Living Arrangements: Other (Comment) (homeless) Legal Guardian: Other: Name of Psychiatrist: NA Name of Therapist: NA  Education Status Is patient currently in school?: No Current Grade: NA Highest grade of  school patient has completed: NA Name of school: NA Contact person: NA  Risk to self with the past 6 months Suicidal Ideation: No Has patient been a risk to self within the past 6 months prior to admission? : Other (comment) Suicidal Intent: No Has patient had any suicidal intent within the past 6 months prior to admission? : Other (comment) Is patient at risk for suicide?: No Suicidal Plan?: No Has patient had any suicidal plan within the past 6 months prior to admission? : No Access to Means: No What has been your use of drugs/alcohol within the last 12 months?: NA Previous Attempts/Gestures: No How many times?: 0 Other Self Harm Risks: NA Triggers for Past Attempts: Unknown Intentional Self Injurious Behavior: None Family Suicide History: Unable to assess Recent stressful life event(s): Other (Comment) (unknown) Persecutory voices/beliefs?: No Depression: No Depression Symptoms:  (unknown) Substance abuse history and/or treatment for substance abuse?: No Suicide prevention information given to non-admitted patients: Not applicable  Risk to Others within the past 6 months Homicidal Ideation: No Does patient have any lifetime risk of violence toward others beyond the six months prior to admission? : Unknown Thoughts of Harm to Others: No Current Homicidal Intent: No Current Homicidal Plan: No Access to Homicidal Means: No Identified Victim: NA History of harm to others?: No Assessment of Violence: None Noted Violent Behavior Description: NA Does patient have access to weapons?: No Criminal Charges Pending?: No Does patient have a court date: No Is patient on probation?: Unknown  Psychosis Hallucinations: Auditory (Pt actively talking to herself) Delusions: None noted  Mental Status Report Appearance/Hygiene: Unremarkable Eye Contact: Poor Motor Activity: Freedom of movement Speech: Tangential, Other (Comment) (  Pt talking to herself) Level of Consciousness:  Alert Mood: Other (Comment) (UTA) Affect: Unable to Assess Anxiety Level: None Thought Processes: Unable to Assess Judgement: Unable to Assess Orientation: Unable to assess Obsessive Compulsive Thoughts/Behaviors: Unable to Assess  Cognitive Functioning Concentration: Unable to Assess Memory: Unable to Assess IQ: Average Insight: Unable to Assess Impulse Control: Unable to Assess Appetite: Fair Weight Loss: 0 Weight Gain: 0 Sleep: Unable to Assess Total Hours of Sleep: 7 Vegetative Symptoms: Unable to Assess  ADLScreening [facility] Assessment Services) Patient's cognitive ability adequate to safely complete daily activities?: No Patient able to express need for assistance with ADLs?: Yes Independently performs ADLs?: Yes (appropriate for developmental age)  Prior Inpatient Therapy Prior Inpatient Therapy: Yes Prior Therapy Dates: unknown Prior Therapy Facilty/Provider(s): unknown Reason for Treatment: unknown  Prior Outpatient Therapy Prior Outpatient Therapy: No Prior Therapy Dates: unknown Prior Therapy Facilty/Provider(s): unknown Reason for Treatment: unknown Does patient have an ACCT team?: No Does patient have Intensive In-House Services?  : No Does patient have [facility] services? : No Does patient have P4CC services?: No  ADL Screening (condition at time of admission) Patient's cognitive ability adequate to safely complete daily activities?: No Patient able to express need for assistance with ADLs?: Yes Independently performs ADLs?: Yes (appropriate for developmental age)                  Additional Information 1:1 In Past 12 Months?: No CIRT Risk: No Elopement Risk: No Does patient have medical clearance?: Yes     Disposition:  Disposition Initial Assessment Completed for this Encounter: Yes Disposition of Patient: Inpatient treatment program Type of inpatient treatment program: Adult  Emmit Pomfret 04/26/2016 3:19 PM

## 2016-04-26 NOTE — ED Provider Notes (Signed)
CSN: [id number]     Arrival date & time 04/26/16  1738 History   First MD Initiated Contact with Patient 04/26/16 1804     Chief Complaint  Patient presents with  . IVC      (Consider location/radiation/quality/duration/timing/severity/associated sxs/prior Treatment) HPI   Kristen [name] is a 35 y.o. female presents with long forceps and, for evaluation of disorganized thought, bizarre statements, and dissociation from reality. She has been committed and the petition has been up, held by staff at the [facility].  Level V caveat- altered mental status   History reviewed. No pertinent past medical history. Past Surgical History  Procedure Laterality Date  . Cesarean section     Family History  Problem Relation Age of Onset  . Family history unknown: Yes   Social History  Substance Use Topics  . Smoking status: Never Smoker   . Smokeless tobacco: None  . Alcohol Use: No   OB History    No data available     Review of Systems  Unable to perform ROS: Mental status change      Allergies  Review of patient's allergies indicates no known allergies.  Home Medications   Prior to Admission medications   Medication Sig Start Date End Date Taking? Authorizing Provider  ARIPiprazole (ABILIFY) 9.75 MG/1.3ML injection Inject 9.75 mg into the muscle every 30 (thirty) days.    Historical Provider, MD  cephALEXin (KEFLEX) 500 MG capsule Take 1 capsule (500 mg total) by mouth 4 (four) times daily. 05/13/13   Marissa Sciacca, PA-C  HYDROcodone-acetaminophen (NORCO) 5-325 MG per tablet Take 1 tablet by mouth every 8 (eight) hours as needed for pain. 05/13/13   Marissa Sciacca, PA-C  ibuprofen (ADVIL,MOTRIN) 200 MG tablet Take 200 mg by mouth every 6 (six) hours as needed. For tooth pain    Historical Provider, MD  lithium carbonate 300 MG capsule Take 300 mg by mouth at bedtime.    Historical Provider, MD  mirtazapine (REMERON) 30 MG tablet Take 30 mg by mouth at  bedtime.    Historical Provider, MD  moxifloxacin (VIGAMOX) 0.5 % ophthalmic solution Place 1 drop into the right eye 6 (six) times daily. 04/04/15   Blake Divine, MD  sulfamethoxazole-trimethoprim (BACTRIM DS,SEPTRA DS) 800-160 MG per tablet Take 1 tablet by mouth 2 (two) times daily. One po bid x 7 days 05/19/13   Leroy Sea, MD   BP 189/104 mmHg  Pulse 105  Temp(Src) 98.1 F (36.7 C) (Oral)  Resp 20  SpO2 97%  LMP  Physical Exam  Constitutional: She appears well-developed and well-nourished.  HENT:  Head: Normocephalic and atraumatic.  Right Ear: External ear normal.  Left Ear: External ear normal.  Eyes: Conjunctivae and EOM are normal. Pupils are equal, round, and reactive to light.  Neck: Normal range of motion and phonation normal. Neck supple.  Cardiovascular: Normal rate.   Pulmonary/Chest: Effort normal. She exhibits no bony tenderness.  Musculoskeletal: Normal range of motion.  Neurological: She is alert. No cranial nerve deficit or sensory deficit. She exhibits normal muscle tone. Coordination normal.  No dysarthria or aphasia. She is able to state that she was brought here by ambulance and police officers.  Skin: Skin is warm, dry and intact.  Psychiatric:  Flight of ideas. Poor attention span. Bursts out in song when answering questions, occasionally.  Nursing note and vitals reviewed.   ED Course  Procedures (including critical care time)  Initial clinical impression- acute psychosis, resents under commitment, with  first opinion signed, for evaluation and clearance.  Medications  lithium carbonate capsule 300 mg (not administered)  mirtazapine (REMERON) tablet 30 mg (not administered)  LORazepam (ATIVAN) injection 2 mg (not administered)  ziprasidone (GEODON) injection 20 mg (not administered)  potassium chloride SA (K-DUR,KLOR-CON) CR tablet 40 mEq (not administered)    Patient Vitals for the past 24 hrs:  BP Temp Temp src Pulse Resp SpO2  04/26/16 1754  (!) 189/104 mmHg 98.1 F (36.7 C) Oral 105 20 97 %   18:50- nursing staff is asking the patient be sedated because she is saying loudly and disturbing other patients.  7:31 PM Reevaluation with update and discussion. After initial assessment and treatment, an updated evaluation reveals She is medically cleared for psychiatric treatment and admission. Incidental hypokalemia is treated with oral potassium. Repeat potassium level ordered for the morning. She does not have any clinical concern for ongoing potassium loss. [name]   Labs Review Labs Reviewed  COMPREHENSIVE METABOLIC PANEL - Abnormal; Notable for the following:    Potassium 2.9 (*)    Glucose, Bld 136 (*)    All other components within normal limits  ACETAMINOPHEN LEVEL - Abnormal; Notable for the following:    Acetaminophen (Tylenol), Serum <10 (*)    All other components within normal limits  CBC - Abnormal; Notable for the following:    WBC 10.8 (*)    HCT 35.0 (*)    MCV 73.2 (*)    MCH 25.5 (*)    Platelets 401 (*)    All other components within normal limits  ETHANOL  SALICYLATE LEVEL  URINE RAPID DRUG SCREEN, HOSP PERFORMED  LITHIUM LEVEL  POTASSIUM    Imaging Review No results found. I have personally reviewed and evaluated these images and lab results as part of my medical decision-making.   EKG Interpretation None      MDM   Final diagnoses:  Psychosis, unspecified psychosis type  Hypokalemia    Acute psychosis, under involuntary commitment, requiring treatment by psychiatry and likely will need to be placed in a psychiatric hospital.  Nursing Notes Reviewed/ Care Coordinated, and agree without changes. Applicable Imaging Reviewed.  Interpretation of Laboratory Data incorporated into ED treatment  Plan: As per TTS in conjunction with oncoming provider team    Mancel Bale, MD 04/26/16 1932

## 2016-04-26 NOTE — Progress Notes (Signed)
Attempted to secure placement at the following facilities:   Faxed to: [facility]- [facility]- [facility]- [facility]  No Beds: [place]- [facility]- [facility]- [facility]- [facility]- left message [place]   Maryelizabeth Rowan, MSW, [facility] Triage Specialist [phone]

## 2016-04-26 NOTE — ED Notes (Signed)
Per [employer]. Patient was taken to [facility] per EMS. BH had patient IVC'd IVC papers state that the patient is dis organized, chanting, yelling, making bizare statement, "I need to free myself from myself." Agitated, stomping feet, banging things.

## 2016-04-26 NOTE — BH Specialist Note (Signed)
Pt brought by walk-in to [facility] for assessment by [facility] (140/70  80  20  CBG 162   GCS 15  SpO2  100%) -  transferred via [employer] with IVC papers to [facility] for medical clearance 15 minutes ago. Still with bizarre presentation smiling, singing loudly, and becoming teary eyed. Past psych history for psychosis NOS.  Has 37 year old daughter Cleon Gustin) who is in the care of "Barbaraann Rondo [phone]).

## 2016-04-27 DIAGNOSIS — F6381 Intermittent explosive disorder: Secondary | ICD-10-CM | POA: Diagnosis present

## 2016-04-27 DIAGNOSIS — F310 Bipolar disorder, current episode hypomanic: Secondary | ICD-10-CM

## 2016-04-27 DIAGNOSIS — F29 Unspecified psychosis not due to a substance or known physiological condition: Secondary | ICD-10-CM | POA: Insufficient documentation

## 2016-04-27 LAB — POTASSIUM: Potassium: 3.4 mmol/L — ABNORMAL LOW (ref 3.5–5.1)

## 2016-04-27 MED ORDER — LORAZEPAM 1 MG PO TABS
1.0000 mg | ORAL_TABLET | Freq: Three times a day (TID) | ORAL | Status: DC | PRN
  Administered 2016-04-27: 1 mg via ORAL
  Filled 2016-04-27: qty 1

## 2016-04-27 MED ORDER — CARBAMAZEPINE 200 MG PO TABS
200.0000 mg | ORAL_TABLET | Freq: Two times a day (BID) | ORAL | Status: DC
  Administered 2016-04-27 – 2016-04-28 (×2): 200 mg via ORAL
  Filled 2016-04-27 (×4): qty 1

## 2016-04-27 MED ORDER — HYDROXYZINE HCL 25 MG PO TABS
50.0000 mg | ORAL_TABLET | Freq: Three times a day (TID) | ORAL | Status: DC | PRN
  Administered 2016-04-27 – 2016-04-28 (×2): 50 mg via ORAL
  Filled 2016-04-27 (×2): qty 2

## 2016-04-27 MED ORDER — LORAZEPAM 1 MG PO TABS
2.0000 mg | ORAL_TABLET | Freq: Once | ORAL | Status: AC
  Administered 2016-04-27: 2 mg via ORAL
  Filled 2016-04-27: qty 2

## 2016-04-27 MED ORDER — ARIPIPRAZOLE 5 MG PO TABS
5.0000 mg | ORAL_TABLET | Freq: Every day | ORAL | Status: DC
  Administered 2016-04-28: 5 mg via ORAL
  Filled 2016-04-27 (×2): qty 1

## 2016-04-27 MED ORDER — TRAZODONE HCL 100 MG PO TABS
100.0000 mg | ORAL_TABLET | Freq: Every evening | ORAL | Status: DC | PRN
  Administered 2016-04-27: 100 mg via ORAL
  Filled 2016-04-27: qty 1

## 2016-04-27 NOTE — Consult Note (Signed)
[facility]iat[facility] Consult   Reason for Consult:  Disorganized speech and behavior, agitation,  Referring Physician:  EDP Patient Identification: Kristen [name] MRN:  [id number] Principal Diagnosis: Bipolar disorder, current episode hypomanic [facility]) Diagnosis:   Patient Active Problem List   Diagnosis Date Noted  . Intermittent explosive disorder [F63.81] 04/27/2016    Priority: High  . Bipolar disorder, current episode hypomanic ([place]) [F31.0] 04/27/2016    Priority: High  . Abscess of finger of left hand [L02.512] 05/19/2013    Total Time spent with patient: 45 minutes  Subjective:   Kristen [name] is a 35 y.o. female patient admitted with Disorganized speech and behavior, agitation,   HPI:  [name], 35 years old was evaluated for agitation and disorganized speech and behavior.  Patient was brought in by [facility] under an IVC for yelling, chanting and for making bizzre statement stating she need to free herself.   Patient was banging on things.  This morning she could not explain much the  circumstances that led to her coming to the ER other tan stating her daughter's best Friends mother called the Police.  Her speech is pressured with flight of ideas.   She lacks insight into what her mental status is but then stated she need help getting back on her medications.  She was seen back in 2012 by one of our former Psychiatrist was diagnosed with Bipolar disorder.  She does not remember when she saw a Psychiatrist last or if she is taking any Psychotropic.  She look disheveled and unkempt.  She reports receiving medication for weight loss.  She denies SI/HI/AVH.  She has been accepted for admission and we will be seeking placement at any facility with availble bed.  Past Psychiatric History:  Bipolar disorder  Risk to Self: Is patient at risk for suicide?: No Risk to Others:   Prior Inpatient Therapy:   Prior Outpatient Therapy:    Past Medical History: History reviewed. No pertinent  past medical history.  Past Surgical History  Procedure Laterality Date  . Cesarean section     Family History:  Family History  Problem Relation Age of Onset  . Family history unknown: Yes   Family Psychiatric  History: Unable to obtain Social History:  History  Alcohol Use No     History  Drug Use No    Social History   Social History  . Marital Status: Single    Spouse Name: N/A  . Number of Children: N/A  . Years of Education: N/A   Social History Main Topics  . Smoking status: Never Smoker   . Smokeless tobacco: None  . Alcohol Use: No  . Drug Use: No  . Sexual Activity: Not Asked   Other Topics Concern  . None   Social History Narrative   Additional Social History:    Allergies:  No Known Allergies  Labs:  Results for orders placed or performed during the hospital encounter of 04/26/16 (from the past 48 hour(s))  Comprehensive metabolic panel     Status: Abnormal   Collection Time: 04/26/16  6:10 PM  Result Value Ref Range   Sodium 135 135 - 145 mmol/L   Potassium 2.9 (L) 3.5 - 5.1 mmol/L   Chloride 103 101 - 111 mmol/L   CO2 24 22 - 32 mmol/L   Glucose, Bld 136 (H) 65 - 99 mg/dL   BUN 11 6 - 20 mg/dL   Creatinine, Ser 0.77 0.44 - 1.00 mg/dL   Calcium 9.6 8.9 -  10.3 mg/dL   Total Protein 7.8 6.5 - 8.1 g/dL   Albumin 4.4 3.5 - 5.0 g/dL   AST 24 15 - 41 U/L   ALT 17 14 - 54 U/L   Alkaline Phosphatase 74 38 - 126 U/L   Total Bilirubin 1.0 0.3 - 1.2 mg/dL   GFR calc non Af Amer >60 >60 mL/min   GFR calc Af Amer >60 >60 mL/min    Comment: (NOTE) The eGFR has been calculated using the CKD EPI equation. This calculation has not been validated in all clinical situations. eGFR's persistently <60 mL/min signify possible Chronic Kidney Disease.    Anion gap 8 5 - 15  cbc     Status: Abnormal   Collection Time: 04/26/16  6:10 PM  Result Value Ref Range   WBC 10.8 (H) 4.0 - 10.5 K/uL   RBC 4.78 3.87 - 5.11 MIL/uL   Hemoglobin 12.2 12.0 - 15.0 g/dL    HCT 35.0 (L) 36.0 - 46.0 %   MCV 73.2 (L) 78.0 - 100.0 fL   MCH 25.5 (L) 26.0 - 34.0 pg   MCHC 34.9 30.0 - 36.0 g/dL   RDW 14.6 11.5 - 15.5 %   Platelets 401 (H) 150 - 400 K/uL  Ethanol     Status: None   Collection Time: 04/26/16  6:20 PM  Result Value Ref Range   Alcohol, Ethyl (B) <5 <5 mg/dL    Comment:        LOWEST DETECTABLE LIMIT FOR SERUM ALCOHOL IS 5 mg/dL FOR MEDICAL PURPOSES ONLY   Salicylate level     Status: None   Collection Time: 04/26/16  6:20 PM  Result Value Ref Range   Salicylate Lvl <5.9 2.8 - 30.0 mg/dL  Acetaminophen level     Status: Abnormal   Collection Time: 04/26/16  6:20 PM  Result Value Ref Range   Acetaminophen (Tylenol), Serum <10 (L) 10 - 30 ug/mL    Comment:        THERAPEUTIC CONCENTRATIONS VARY SIGNIFICANTLY. A RANGE OF 10-30 ug/mL MAY BE AN EFFECTIVE CONCENTRATION FOR MANY PATIENTS. HOWEVER, SOME ARE BEST TREATED AT CONCENTRATIONS OUTSIDE THIS RANGE. ACETAMINOPHEN CONCENTRATIONS >150 ug/mL AT 4 HOURS AFTER INGESTION AND >50 ug/mL AT 12 HOURS AFTER INGESTION ARE OFTEN ASSOCIATED WITH TOXIC REACTIONS.   Rapid urine drug screen (hospital performed)     Status: None   Collection Time: 04/26/16  7:02 PM  Result Value Ref Range   Opiates NONE DETECTED NONE DETECTED   Cocaine NONE DETECTED NONE DETECTED   Benzodiazepines NONE DETECTED NONE DETECTED   Amphetamines NONE DETECTED NONE DETECTED   Tetrahydrocannabinol NONE DETECTED NONE DETECTED   Barbiturates NONE DETECTED NONE DETECTED    Comment:        DRUG SCREEN FOR MEDICAL PURPOSES ONLY.  IF CONFIRMATION IS NEEDED FOR ANY PURPOSE, NOTIFY LAB WITHIN 5 DAYS.        LOWEST DETECTABLE LIMITS FOR URINE DRUG SCREEN Drug Class       Cutoff (ng/mL) Amphetamine      1000 Barbiturate      200 Benzodiazepine   563 Tricyclics       875 Opiates          300 Cocaine          300 THC              50   Lithium level     Status: Abnormal   Collection Time: 04/26/16  7:12 PM  Result  Value Ref Range   Lithium Lvl <0.06 (L) 0.60 - 1.20 mmol/L  Potassium     Status: Abnormal   Collection Time: 04/27/16  9:45 AM  Result Value Ref Range   Potassium 3.4 (L) 3.5 - 5.1 mmol/L    Current Facility-Administered Medications  Medication Dose Route Frequency Provider Last Rate Last Dose  . carbamazepine (TEGRETOL) tablet 200 mg  200 mg Oral BID PC Anikka Marsan, MD      . LORazepam (ATIVAN) injection 2 mg  2 mg Intramuscular Q4H PRN Daleen Bo, MD   2 mg at 04/26/16 1944  . traZODone (DESYREL) tablet 100 mg  100 mg Oral QHS PRN Johnell Landowski, MD      . ziprasidone (GEODON) injection 20 mg  20 mg Intramuscular Q12H PRN Daleen Bo, MD   20 mg at 04/26/16 1944   Current Outpatient Prescriptions  Medication Sig Dispense Refill  . moxifloxacin (VIGAMOX) 0.5 % ophthalmic solution Place 1 drop into the right eye 6 (six) times daily. 3 mL 0  . phentermine 37.5 MG capsule Take 37.5 mg by mouth daily.      Musculoskeletal: Strength & Muscle Tone: within normal limits Gait & Station: normal Patient leans: N/A  Psychiatric Specialty Exam: Physical Exam  Review of Systems  Constitutional: Negative.   HENT: Negative.   Eyes: Negative.   Respiratory: Negative.   Cardiovascular: Negative.   Gastrointestinal: Negative.   Genitourinary: Negative.   Musculoskeletal: Negative.   Skin: Negative.   Neurological: Negative.   Endo/Heme/Allergies: Negative.     Blood pressure 166/93, pulse 92, temperature 97.8 F (36.6 C), temperature source Oral, resp. rate 18, SpO2 100 %.There is no weight on file to calculate BMI.  General Appearance: Casual and Disheveled  Eye Contact:  Fair  Speech:  Pressured  Volume:  Normal  Mood:  Anxious and Irritable  Affect:  Congruent  Thought Process:  Disorganized and Descriptions of Associations: Tangential  Orientation:  Full (Time, Place, and Person)  Thought Content:  Paranoid Ideation and Tangential  Suicidal Thoughts:  No  Homicidal  Thoughts:  No  Memory:  Immediate;   Poor Recent;   Poor Remote;   Poor  Judgement:  Poor  Insight:  Shallow  Psychomotor Activity:  Increased  Concentration:  Concentration: Poor  Recall:  NA  Fund of Knowledge:  Poor  Language:  Fair  Akathisia:  No  Handed:  Right  AIMS (if indicated):     Assets:  Desire for Improvement  ADL's:  Impaired  Cognition:  Impaired,  Moderate  Sleep:        Treatment Plan Summary: Daily contact with patient to assess and evaluate symptoms and progress in treatment and Medication management  Disposition:  Accepted for admission and we will be seeking placement at any facility with available bed.  We have started patient on Tegretol 200 mg po bid for food stabilization.  Abilify 5 mg po daily for mood control, Trazodone 100 mg po at bed time for sleep.  Hydroxyzine 50 mg po every 8 hours as needed for anxiety.  Delfin Gant, NP    PMHNP-BC 04/27/2016 11:38 AM Patient seen face-to-face for psychiatric evaluation, chart reviewed and case discussed with the physician extender and developed treatment plan. Reviewed the information documented and agree with the treatment plan. Corena Pilgrim, MD

## 2016-04-27 NOTE — ED Notes (Signed)
Pt awake, alert & responsive, no distress noted, calm & cooperative at present.  Pt slow to respond to questions asked.  Monitoring for safety, Q 15 min checks in effect.

## 2016-04-27 NOTE — ED Notes (Signed)
Patient has been labile today going from being pleasant and cooperative to rude and verbally disruptive.  She complained of anxiety early in the day and requested medications.  She was given a one time dose of lorazepam 2 mg PO, which was effective.  She has made several phone calls today and is often rude and disrespectful of whomever is on the phone.  Her appetite has been good.

## 2016-04-27 NOTE — BH Assessment (Signed)
[facility] Assessment Progress Note  Per Thedore Mins, MD, this pt continues to require psychiatric hospitalization at this time.  The following facilities have been contacted to seek placement for this pt, with results as noted:  Beds available, information sent, decision pending:  [facility] [place] [facility]   At capacity:  [place] [facility] [place]    Doylene Canning, [place] Triage Specialist [phone]

## 2016-04-27 NOTE — Progress Notes (Signed)
[facility] left voicemail for Clinical research associate stating pt was declined for admission. No reason given.

## 2016-04-27 NOTE — ED Notes (Addendum)
Pt becoming more agitated, stating she wants to leave this facility, cursing at staff and paranoid. Resting at present, took oral sleep med.

## 2016-04-28 DIAGNOSIS — F310 Bipolar disorder, current episode hypomanic: Secondary | ICD-10-CM | POA: Diagnosis not present

## 2016-04-28 NOTE — ED Notes (Signed)
Pt's thoughts are disorganized and she endorse hearing voices. Several times this morning she sought Ativan and was given Vistaril instead because Ativan was not ordered for her. She refused to sign her belongings sheet at discharge preferring to argue about them and feeling paranoid about her belongings being returned the [place] took her to [facility].

## 2016-04-28 NOTE — Progress Notes (Signed)
CSW was informed by Psychiatrist and NP to call the [employer] and speak with Fontaine No as patient states she may loose housing. NP stated patient wanted Ms. Laural Benes to know she is in the hospital. CSW left a Engineer, technical sales for [employer] to return call.  Elenore Paddy [phone] ED CSW 04/28/2016 11:50 AM

## 2016-04-28 NOTE — Consult Note (Signed)
[place] Psychiatry Consult   Reason for Consult:  Disorganized speech and behavior, agitation,  Referring Physician:  EDP Patient Identification: Kristen [name] MRN:  [id number] Principal Diagnosis: Bipolar disorder, current episode hypomanic [facility]) Diagnosis:   Patient Active Problem List   Diagnosis Date Noted  . Intermittent explosive disorder [F63.81] 04/27/2016    Priority: High  . Bipolar disorder, current episode hypomanic ([place]) [F31.0] 04/27/2016    Priority: High  . Psychoses [F29]   . Abscess of finger of left hand [L02.512] 05/19/2013    Total Time spent with patient: 30 minutes  Subjective:   Kristen [name] is a 35 y.o. female patient admitted with Disorganized speech and behavior, agitation,   HPI:  On admission:  [name], 35 years old was evaluated for agitation and disorganized speech and behavior.  Patient was brought in by [facility] under an IVC for yelling, chanting and for making bizzre statement stating she need to free herself.   Patient was banging on things.  This morning she could not explain much the  circumstances that led to her coming to the ER other tan stating her daughter's best Friends mother called the Police.  Her speech is pressured with flight of ideas.   She lacks insight into what her mental status is but then stated she need help getting back on her medications.  She was seen back in 2012 by one of our former Psychiatrist was diagnosed with Bipolar disorder.  She does not remember when she saw a Psychiatrist last or if she is taking any Psychotropic.  She look disheveled and unkempt.  She reports receiving medication for weight loss.  She denies SI/HI/AVH.  She has been accepted for admission and we will be seeking placement at any facility with availble bed.  Today, patient remains less disorganized, not yelling, but continues to pace around the unit.  She was able to sit for a few minutes but had difficulty getting her thoughts together.  Kamill  reports she felt stable when her anxiety medication which she cannot remember was ordered and she had therapy.  Continues to hear voices the "make no sense."  Past Psychiatric History:  Bipolar disorder  Risk to Self: Is patient at risk for suicide?: No Risk to Others:   Prior Inpatient Therapy:   Prior Outpatient Therapy:    Past Medical History: History reviewed. No pertinent past medical history.  Past Surgical History  Procedure Laterality Date  . Cesarean section     Family History:  Family History  Problem Relation Age of Onset  . Family history unknown: Yes   Family Psychiatric  History: Unable to obtain Social History:  History  Alcohol Use No     History  Drug Use No    Social History   Social History  . Marital Status: Single    Spouse Name: N/A  . Number of Children: N/A  . Years of Education: N/A   Social History Main Topics  . Smoking status: Never Smoker   . Smokeless tobacco: None  . Alcohol Use: No  . Drug Use: No  . Sexual Activity: Not Asked   Other Topics Concern  . None   Social History Narrative   Additional Social History:    Allergies:  No Known Allergies  Labs:  Results for orders placed or performed during the hospital encounter of 04/26/16 (from the past 48 hour(s))  Comprehensive metabolic panel     Status: Abnormal   Collection Time: 04/26/16  6:10 PM  Result Value Ref Range   Sodium 135 135 - 145 mmol/L   Potassium 2.9 (L) 3.5 - 5.1 mmol/L   Chloride 103 101 - 111 mmol/L   CO2 24 22 - 32 mmol/L   Glucose, Bld 136 (H) 65 - 99 mg/dL   BUN 11 6 - 20 mg/dL   Creatinine, Ser 0.77 0.44 - 1.00 mg/dL   Calcium 9.6 8.9 - 10.3 mg/dL   Total Protein 7.8 6.5 - 8.1 g/dL   Albumin 4.4 3.5 - 5.0 g/dL   AST 24 15 - 41 U/L   ALT 17 14 - 54 U/L   Alkaline Phosphatase 74 38 - 126 U/L   Total Bilirubin 1.0 0.3 - 1.2 mg/dL   GFR calc non Af Amer >60 >60 mL/min   GFR calc Af Amer >60 >60 mL/min    Comment: (NOTE) The eGFR has been  calculated using the CKD EPI equation. This calculation has not been validated in all clinical situations. eGFR's persistently <60 mL/min signify possible Chronic Kidney Disease.    Anion gap 8 5 - 15  cbc     Status: Abnormal   Collection Time: 04/26/16  6:10 PM  Result Value Ref Range   WBC 10.8 (H) 4.0 - 10.5 K/uL   RBC 4.78 3.87 - 5.11 MIL/uL   Hemoglobin 12.2 12.0 - 15.0 g/dL   HCT 35.0 (L) 36.0 - 46.0 %   MCV 73.2 (L) 78.0 - 100.0 fL   MCH 25.5 (L) 26.0 - 34.0 pg   MCHC 34.9 30.0 - 36.0 g/dL   RDW 14.6 11.5 - 15.5 %   Platelets 401 (H) 150 - 400 K/uL  Ethanol     Status: None   Collection Time: 04/26/16  6:20 PM  Result Value Ref Range   Alcohol, Ethyl (B) <5 <5 mg/dL    Comment:        LOWEST DETECTABLE LIMIT FOR SERUM ALCOHOL IS 5 mg/dL FOR MEDICAL PURPOSES ONLY   Salicylate level     Status: None   Collection Time: 04/26/16  6:20 PM  Result Value Ref Range   Salicylate Lvl <1.0 2.8 - 30.0 mg/dL  Acetaminophen level     Status: Abnormal   Collection Time: 04/26/16  6:20 PM  Result Value Ref Range   Acetaminophen (Tylenol), Serum <10 (L) 10 - 30 ug/mL    Comment:        THERAPEUTIC CONCENTRATIONS VARY SIGNIFICANTLY. A RANGE OF 10-30 ug/mL MAY BE AN EFFECTIVE CONCENTRATION FOR MANY PATIENTS. HOWEVER, SOME ARE BEST TREATED AT CONCENTRATIONS OUTSIDE THIS RANGE. ACETAMINOPHEN CONCENTRATIONS >150 ug/mL AT 4 HOURS AFTER INGESTION AND >50 ug/mL AT 12 HOURS AFTER INGESTION ARE OFTEN ASSOCIATED WITH TOXIC REACTIONS.   Rapid urine drug screen (hospital performed)     Status: None   Collection Time: 04/26/16  7:02 PM  Result Value Ref Range   Opiates NONE DETECTED NONE DETECTED   Cocaine NONE DETECTED NONE DETECTED   Benzodiazepines NONE DETECTED NONE DETECTED   Amphetamines NONE DETECTED NONE DETECTED   Tetrahydrocannabinol NONE DETECTED NONE DETECTED   Barbiturates NONE DETECTED NONE DETECTED    Comment:        DRUG SCREEN FOR MEDICAL PURPOSES ONLY.  IF  CONFIRMATION IS NEEDED FOR ANY PURPOSE, NOTIFY LAB WITHIN 5 DAYS.        LOWEST DETECTABLE LIMITS FOR URINE DRUG SCREEN Drug Class       Cutoff (ng/mL) Amphetamine      1000 Barbiturate  200 Benzodiazepine   694 Tricyclics       854 Opiates          300 Cocaine          300 THC              50   Lithium level     Status: Abnormal   Collection Time: 04/26/16  7:12 PM  Result Value Ref Range   Lithium Lvl <0.06 (L) 0.60 - 1.20 mmol/L  Potassium     Status: Abnormal   Collection Time: 04/27/16  9:45 AM  Result Value Ref Range   Potassium 3.4 (L) 3.5 - 5.1 mmol/L    Current Facility-Administered Medications  Medication Dose Route Frequency Provider Last Rate Last Dose  . ARIPiprazole (ABILIFY) tablet 5 mg  5 mg Oral Daily Delfin Gant, NP   5 mg at 04/28/16 0912  . carbamazepine (TEGRETOL) tablet 200 mg  200 mg Oral BID PC Tamey Wanek, MD   200 mg at 04/28/16 0912  . hydrOXYzine (ATARAX/VISTARIL) tablet 50 mg  50 mg Oral TID PRN Delfin Gant, NP   50 mg at 04/27/16 2008  . traZODone (DESYREL) tablet 100 mg  100 mg Oral QHS PRN Corena Pilgrim, MD   100 mg at 04/27/16 2159  . ziprasidone (GEODON) injection 20 mg  20 mg Intramuscular Q12H PRN Daleen Bo, MD   20 mg at 04/26/16 1944   Current Outpatient Prescriptions  Medication Sig Dispense Refill  . moxifloxacin (VIGAMOX) 0.5 % ophthalmic solution Place 1 drop into the right eye 6 (six) times daily. 3 mL 0  . phentermine 37.5 MG capsule Take 37.5 mg by mouth daily.      Musculoskeletal: Strength & Muscle Tone: within normal limits Gait & Station: normal Patient leans: N/A  Psychiatric Specialty Exam: Physical Exam  Constitutional: She is oriented to person, place, and time. She appears well-developed and well-nourished.  HENT:  Head: Normocephalic.  Neck: Normal range of motion.  Respiratory: Effort normal.  Musculoskeletal: Normal range of motion.  Neurological: She is alert and oriented to  person, place, and time.  Skin: Skin is warm and dry.  Psychiatric: Her mood appears anxious. Her affect is labile. Her speech is tangential. She is hyperactive and actively hallucinating. Thought content is delusional. Cognition and memory are impaired. She expresses impulsivity.    Review of Systems  Constitutional: Negative.   HENT: Negative.   Eyes: Negative.   Respiratory: Negative.   Cardiovascular: Negative.   Gastrointestinal: Negative.   Genitourinary: Negative.   Musculoskeletal: Negative.   Skin: Negative.   Neurological: Negative.   Endo/Heme/Allergies: Negative.   Psychiatric/Behavioral: Positive for hallucinations. The patient is nervous/anxious.     Blood pressure 115/70, pulse 78, temperature 98.4 F (36.9 C), temperature source Oral, resp. rate 17, SpO2 95 %.There is no weight on file to calculate BMI.  General Appearance: Casual and Disheveled  Eye Contact:  Fair  Speech:  Normal  Volume:  Normal  Mood:  Anxious, pleasant but labile  Affect:  Congruent  Thought Process:  Disorganized at time but clear at others  Orientation:  Full (Time, Place, and Person)  Thought Content:  Delusions, auditory hallucinations  Suicidal Thoughts:  No  Homicidal Thoughts:  No  Memory:  Immediate, fair; Recent, fair; Remote, fair  Judgement:  Impaired  Insight:  Shallow  Psychomotor Activity:  Increased  Concentration:  Concentration: Poor  Recall:  NA  Fund of Knowledge:  Poor  Language:  Fair  Akathisia:  No  Handed:  Right  AIMS (if indicated):     Assets:  Desire for Improvement  ADL's:  Impaired  Cognition:  Impaired,  Moderate  Sleep:        Treatment Plan Summary: Daily contact with patient to assess and evaluate symptoms and progress in treatment and Medication management bipolar affective disorder, recent episode hypomanic with psychotic features: -Crisis stabilization -Medication management:  Continue Abilify 5 mg daily for depression, Tegretol 200 mg BID  for mood stabilization, Vistaril 50 mg TID PRN anxiety, and Trazodone 100 mg PRN sleep -Individual counseling -Transfer to [facility]  Disposition: Transfer to [place] Unit  Waylan Boga, NP    PMHNP-BC 04/28/2016 10:46 AM Patient seen face-to-face for psychiatric evaluation, chart reviewed and case discussed with the physician extender and developed treatment plan. Reviewed the information documented and agree with the treatment plan. Corena Pilgrim, MD

## 2016-04-28 NOTE — BH Assessment (Signed)
[facility] Assessment Progress Note  Per Thedore Mins, MD, this pt requires psychiatric hospitalization at this time.  At 10:35 Shanda Bumps calls from [facility] to report that pt has been accepted to their facility by Dr Enedina Finner.  Dr Jannifer Franklin concurs with this decision.  Pt's nurse, Diane, has been notified, and agrees to call report to [phone].  Pt is under IVC and is to be transported via [facility].  Doylene Canning, MA Triage Specialist [phone]

## 2016-05-07 ENCOUNTER — Encounter (HOSPITAL_COMMUNITY): Payer: Self-pay | Admitting: Emergency Medicine

## 2016-05-07 ENCOUNTER — Emergency Department (HOSPITAL_COMMUNITY)
Admission: EM | Admit: 2016-05-07 | Discharge: 2016-05-07 | Disposition: A | Discharge status: Home / Self Care | Payer: Medicaid Other | Attending: Emergency Medicine | Admitting: Emergency Medicine

## 2016-05-07 DIAGNOSIS — Z590 Homelessness: Secondary | ICD-10-CM | POA: Diagnosis not present

## 2016-05-07 DIAGNOSIS — F209 Schizophrenia, unspecified: Secondary | ICD-10-CM | POA: Insufficient documentation

## 2016-05-07 DIAGNOSIS — Z3202 Encounter for pregnancy test, result negative: Secondary | ICD-10-CM | POA: Insufficient documentation

## 2016-05-07 LAB — CBC
HEMATOCRIT: 33.7 % — AB (ref 36.0–46.0)
Hemoglobin: 11.6 g/dL — ABNORMAL LOW (ref 12.0–15.0)
MCH: 25.4 pg — ABNORMAL LOW (ref 26.0–34.0)
MCHC: 34.4 g/dL (ref 30.0–36.0)
MCV: 73.9 fL — ABNORMAL LOW (ref 78.0–100.0)
PLATELETS: 347 10*3/uL (ref 150–400)
RBC: 4.56 MIL/uL (ref 3.87–5.11)
RDW: 14.8 % (ref 11.5–15.5)
WBC: 10.8 10*3/uL — AB (ref 4.0–10.5)

## 2016-05-07 LAB — COMPREHENSIVE METABOLIC PANEL
ALT: 21 U/L (ref 14–54)
AST: 26 U/L (ref 15–41)
Albumin: 4.3 g/dL (ref 3.5–5.0)
Alkaline Phosphatase: 89 U/L (ref 38–126)
Anion gap: 7 (ref 5–15)
BUN: 14 mg/dL (ref 6–20)
CALCIUM: 9.3 mg/dL (ref 8.9–10.3)
CHLORIDE: 106 mmol/L (ref 101–111)
CO2: 25 mmol/L (ref 22–32)
CREATININE: 0.74 mg/dL (ref 0.44–1.00)
Glucose, Bld: 155 mg/dL — ABNORMAL HIGH (ref 65–99)
Potassium: 3.6 mmol/L (ref 3.5–5.1)
Sodium: 138 mmol/L (ref 135–145)
TOTAL PROTEIN: 7.6 g/dL (ref 6.5–8.1)
Total Bilirubin: 0.4 mg/dL (ref 0.3–1.2)

## 2016-05-07 LAB — RAPID URINE DRUG SCREEN, HOSP PERFORMED
Amphetamines: NOT DETECTED
BENZODIAZEPINES: NOT DETECTED
Barbiturates: NOT DETECTED
COCAINE: NOT DETECTED
OPIATES: NOT DETECTED
Tetrahydrocannabinol: NOT DETECTED

## 2016-05-07 LAB — ETHANOL

## 2016-05-07 LAB — HCG, QUANTITATIVE, PREGNANCY: hCG, Beta Chain, Quant, S: <1 m[IU]/mL (ref <5)

## 2016-05-07 NOTE — ED Notes (Signed)
Per EMS, pt lives with a friend.  Pt has schizophrenia and is homeless.  Pt is stating that she is "pregnant and the baby is dying inside of her".  Per family, hx of same and pt is not pregnant.  No other complaints of vaginal discharge/bleeding.  No abdominal pain.  Vitals:  160/78, hr 100, resp 16,

## 2016-05-07 NOTE — ED Notes (Signed)
Pt aware urine sample is needed, unable at this time 

## 2016-05-07 NOTE — Discharge Instructions (Signed)
You are not pregnant. The test today is negative.

## 2016-05-07 NOTE — ED Notes (Signed)
Pt picked up at [address].  With friends.  Pt has Aunt picking up her daughter to let her live with her.  Pt has no place to d/c to.

## 2016-05-07 NOTE — ED Provider Notes (Signed)
CSN: [id number]     Arrival date & time 05/07/16  1858 History   First MD Initiated Contact with Patient 05/07/16 2104     Chief Complaint  Patient presents with  . Wants pregnancy test.      (Consider location/radiation/quality/duration/timing/severity/associated sxs/prior Treatment) HPI   Patient to the ER EMS the patient has a PMH of schizophrenia and homelessness. She is currently staying with a friend per EMS. The patient has told the triage nurse that she is pregnant and concerned that the baby is dying inside of her. The family reports that the patient is not pregnant and that she has believed this to be true in the past.  The patient denies having any vaginal complaints, abdominal pains, urinary complaints, back pain. She was last seen on the ED on 5/26 and was transferred to [facility]. Pt does not appear to be in any distress.  Today the patient is calm. She says that she felt a baby kicking in her stomach  and wanted to make sure she isn't pregnant. No other symptoms. She has had her menstrual cycle every month per normal and has not had sex for over 3 months. Denies SI/HI.   History reviewed. No pertinent past medical history. Past Surgical History  Procedure Laterality Date  . Cesarean section     Family History  Problem Relation Age of Onset  . Family history unknown: Yes   Social History  Substance Use Topics  . Smoking status: Never Smoker   . Smokeless tobacco: None  . Alcohol Use: No   OB History    No data available     Review of Systems  Review of Systems All other systems negative except as documented in the HPI. All pertinent positives and negatives as reviewed in the HPI.   Allergies  Review of patient's allergies indicates no known allergies.  Home Medications   Prior to Admission medications   Medication Sig Start Date End Date Taking? Authorizing Provider  buPROPion (WELLBUTRIN XL) 150 MG 24 hr tablet Take 150 mg by mouth daily.  05/05/16 06/04/16 Yes Historical Provider, MD  fluticasone (FLONASE) 50 MCG/ACT nasal spray Place 1 spray into both nostrils daily as needed for allergies.  02/18/16  Yes Historical Provider, MD  moxifloxacin (VIGAMOX) 0.5 % ophthalmic solution Place 1 drop into the right eye 6 (six) times daily. 04/04/15  Yes Blake Divine, MD  phentermine 37.5 MG capsule Take 37.5 mg by mouth daily.   Yes Historical Provider, MD   BP 170/95 mmHg  Pulse 106  Temp(Src) 98.2 F (36.8 C) (Oral)  Resp 20  SpO2 100%  LMP 04/03/2016 Physical Exam  Constitutional: She appears well-developed and well-nourished. No distress.  HENT:  Head: Normocephalic and atraumatic.  Eyes: Pupils are equal, round, and reactive to light.  Neck: Normal range of motion. Neck supple.  Cardiovascular: Normal rate and regular rhythm.   Pulmonary/Chest: Effort normal.  Abdominal: Soft. Bowel sounds are normal. There is no tenderness. There is no rigidity, no rebound, no guarding and no CVA tenderness.  Neurological: She is alert.  Skin: Skin is warm and dry.  Nursing note and vitals reviewed.   ED Course  Procedures (including critical care time) Labs Review Labs Reviewed  COMPREHENSIVE METABOLIC PANEL - Abnormal; Notable for the following:    Glucose, Bld 155 (*)    All other components within normal limits  CBC - Abnormal; Notable for the following:    WBC 10.8 (*)    Hemoglobin  11.6 (*)    HCT 33.7 (*)    MCV 73.9 (*)    MCH 25.4 (*)    All other components within normal limits  ETHANOL  URINE RAPID DRUG SCREEN, HOSP PERFORMED  HCG, QUANTITATIVE, PREGNANCY    Imaging Review No results found. I have personally reviewed and evaluated these images and lab results as part of my medical decision-making.   EKG Interpretation None      MDM   Final diagnoses:  Pregnancy test negative    Negative pregnancy test. Pt denies SI/HI and has no genitourinary complaints to address. She is relieved to hear she isn't  pregnant and requests discharge. Pt calm and cooperative and does not appear to be hearing any voices or seeing things at this time.  Medications - No data to display  I discussed results, diagnoses and plan with Kristen [name]. They voice there understanding and questions were answered. We discussed follow-up recommendations and return precautions.      Marlon Pel, PA-C 05/07/16 2146  Doug Sou, MD 05/08/16 [phone]

## 2016-05-08 ENCOUNTER — Encounter (HOSPITAL_COMMUNITY): Payer: Self-pay | Admitting: Emergency Medicine

## 2016-05-08 ENCOUNTER — Emergency Department (HOSPITAL_COMMUNITY)
Admission: EM | Admit: 2016-05-08 | Discharge: 2016-05-09 | Disposition: A | Discharge status: Home / Self Care | Payer: Medicaid Other | Attending: Emergency Medicine | Admitting: Emergency Medicine

## 2016-05-08 DIAGNOSIS — F6381 Intermittent explosive disorder: Secondary | ICD-10-CM | POA: Diagnosis not present

## 2016-05-08 DIAGNOSIS — R440 Auditory hallucinations: Secondary | ICD-10-CM | POA: Insufficient documentation

## 2016-05-08 DIAGNOSIS — F3181 Bipolar II disorder: Secondary | ICD-10-CM

## 2016-05-08 HISTORY — DX: Prediabetes: R73.03

## 2016-05-08 HISTORY — DX: Schizophrenia, unspecified: F20.9

## 2016-05-08 NOTE — ED Notes (Addendum)
Pt has in belonging bag:  African colored scarf, teal sandals, white charger, black phone, blue dress shirt, leopard pants, Bupropoion pill bottle, brown purse (leopard print purse, pink wallet, [place] driver lic, [place] driver lic, [place] driver lic, [employer] - [id number], bible, beauty supplies)

## 2016-05-08 NOTE — ED Notes (Signed)
Pt states she has schizophrenia and has been having audible hallucinations  Pt states they are good voices  Pt states she is just not feeling herself lately  Denies SI/HI at this time  Pt is cooperative in triage

## 2016-05-09 ENCOUNTER — Encounter (HOSPITAL_COMMUNITY): Payer: Self-pay | Admitting: Emergency Medicine

## 2016-05-09 ENCOUNTER — Emergency Department (HOSPITAL_COMMUNITY)
Admission: EM | Admit: 2016-05-09 | Discharge: 2016-05-09 | Disposition: A | Discharge status: Home / Self Care | Payer: Medicaid Other | Source: Home / Self Care

## 2016-05-09 DIAGNOSIS — F6381 Intermittent explosive disorder: Secondary | ICD-10-CM | POA: Diagnosis not present

## 2016-05-09 DIAGNOSIS — F3181 Bipolar II disorder: Secondary | ICD-10-CM | POA: Diagnosis not present

## 2016-05-09 DIAGNOSIS — Z5321 Procedure and treatment not carried out due to patient leaving prior to being seen by health care provider: Secondary | ICD-10-CM

## 2016-05-09 DIAGNOSIS — R443 Hallucinations, unspecified: Secondary | ICD-10-CM

## 2016-05-09 LAB — RAPID URINE DRUG SCREEN, HOSP PERFORMED
AMPHETAMINES: NOT DETECTED
BARBITURATES: NOT DETECTED
Benzodiazepines: NOT DETECTED
Cocaine: NOT DETECTED
Opiates: NOT DETECTED
Tetrahydrocannabinol: NOT DETECTED

## 2016-05-09 LAB — COMPREHENSIVE METABOLIC PANEL
ALBUMIN: 3.9 g/dL (ref 3.5–5.0)
ALT: 17 U/L (ref 14–54)
AST: 21 U/L (ref 15–41)
Alkaline Phosphatase: 78 U/L (ref 38–126)
Anion gap: 5 (ref 5–15)
BILIRUBIN TOTAL: 0.7 mg/dL (ref 0.3–1.2)
BUN: 11 mg/dL (ref 6–20)
CO2: 24 mmol/L (ref 22–32)
CREATININE: 0.7 mg/dL (ref 0.44–1.00)
Calcium: 9.2 mg/dL (ref 8.9–10.3)
Chloride: 106 mmol/L (ref 101–111)
GFR calc Af Amer: >60 mL/min (ref >60)
Glucose, Bld: 160 mg/dL — ABNORMAL HIGH (ref 65–99)
Potassium: 3 mmol/L — ABNORMAL LOW (ref 3.5–5.1)
Sodium: 135 mmol/L (ref 135–145)
TOTAL PROTEIN: 7.1 g/dL (ref 6.5–8.1)

## 2016-05-09 LAB — ETHANOL

## 2016-05-09 LAB — I-STAT BETA HCG BLOOD, ED (MC, WL, AP ONLY)

## 2016-05-09 LAB — ACETAMINOPHEN LEVEL: Acetaminophen (Tylenol), Serum: <10 ug/mL — ABNORMAL LOW (ref 10–30)

## 2016-05-09 LAB — CBC
HEMATOCRIT: 31.1 % — AB (ref 36.0–46.0)
Hemoglobin: 10.6 g/dL — ABNORMAL LOW (ref 12.0–15.0)
MCH: 25.2 pg — AB (ref 26.0–34.0)
MCHC: 34.1 g/dL (ref 30.0–36.0)
MCV: 74 fL — AB (ref 78.0–100.0)
PLATELETS: 294 10*3/uL (ref 150–400)
RBC: 4.2 MIL/uL (ref 3.87–5.11)
RDW: 14.5 % (ref 11.5–15.5)
WBC: 10.8 10*3/uL — ABNORMAL HIGH (ref 4.0–10.5)

## 2016-05-09 LAB — SALICYLATE LEVEL: Salicylate Lvl: <4 mg/dL (ref 2.8–30.0)

## 2016-05-09 MED ORDER — BUPROPION HCL ER (XL) 150 MG PO TB24
150.0000 mg | ORAL_TABLET | Freq: Every day | ORAL | Status: DC
  Filled 2016-05-09: qty 1

## 2016-05-09 MED ORDER — TOPIRAMATE 25 MG PO TABS: 50.0000 mg | ORAL_TABLET | Freq: Every day | ORAL | Status: DC

## 2016-05-09 NOTE — ED Notes (Signed)
Patient states that she stayed the night here last night and now doesn't have a ride, so checking back in. Patient states having hallucinations but denies SI/HI.  Patient states that she has tried calling her aunt but not answering.  This RN attempted to call aunt amorie but got no answer, left message on voicemail.

## 2016-05-09 NOTE — ED Notes (Signed)
Pt's belongings placed in locker # 43. Purse and 1 bag including clothing.

## 2016-05-09 NOTE — BH Assessment (Signed)
Tele Assessment Note   Kristen [name] is an 35 y.o. female presenting to [facility] reporting auditory hallucinations. Pt also reported that her medications are not working. Pt denies SI and HI at this time. Pt did not report any previous suicide attempts or self-injurious behaviors. Pt denied drug and alcohol use. Pt was recently hospitalized May 2017 and it is unclear if pt is seeing a psychiatrist. Pt was drowsy throughout this assessment and often had to be tapped multiple times to provide a response.  AM Psych eval.   Diagnosis: Schizophrenia   Past Medical History:  Past Medical History  Diagnosis Date  . Schizophrenia (HCC)   . Borderline diabetes     Past Surgical History  Procedure Laterality Date  . Cesarean section      Family History:  Family History  Problem Relation Age of Onset  . Hypertension Other   . Diabetes Other   . CAD Other     Social History:  reports that she has never smoked. She does not have any smokeless tobacco history on file. She reports that she drinks alcohol. She reports that she does not use illicit drugs.  Additional Social History:  Alcohol / Drug Use Pain Medications: pt denies abuse  Prescriptions: pt denies abuse  Over the Counter: pt denies abuse  History of alcohol / drug use?: No history of alcohol / drug abuse  CIWA: CIWA-Ar BP: 132/75 mmHg Pulse Rate: 90 COWS:    PATIENT STRENGTHS: (choose at least two) Average or above average intelligence Motivation for treatment/growth  Allergies: No Known Allergies  Home Medications:  (Not in a hospital admission)  OB/GYN Status:  Patient's last menstrual period was 04/20/2016 (approximate).  General Assessment Data Location of Assessment: [facility] TTS Assessment: In system Is this a Tele or Face-to-Face Assessment?: Face-to-Face Is this an Initial Assessment or a Re-assessment for this encounter?: Initial Assessment Marital status: Single Living Arrangements: Other (Comment)  (homeless) Can pt return to current living arrangement?: Yes Admission Status: Voluntary Is patient capable of signing voluntary admission?: Yes Referral Source: Self/Family/Friend Insurance type: Medicaid     Crisis Care Plan Living Arrangements: Other (Comment) (homeless) Name of Psychiatrist: NA Name of Therapist: NA  Education Status Is patient currently in school?: No Current Grade: N/A Highest grade of school patient has completed: N/A Name of school: N/A Contact person: N/A  Risk to self with the past 6 months Suicidal Ideation: No Has patient been a risk to self within the past 6 months prior to admission? : No Suicidal Intent: No Has patient had any suicidal intent within the past 6 months prior to admission? : No Is patient at risk for suicide?: No Suicidal Plan?: No Has patient had any suicidal plan within the past 6 months prior to admission? : No Access to Means: No What has been your use of drugs/alcohol within the last 12 months?: Pt denies  Previous Attempts/Gestures: No How many times?: 0 Other Self Harm Risks: pt denies Triggers for Past Attempts: Unknown Intentional Self Injurious Behavior: None Family Suicide History: Unknown Recent stressful life event(s):  (unable to assess) Persecutory voices/beliefs?:  (unable to assess) Depression:  (unable to assess) Depression Symptoms:  (unable to assess) Substance abuse history and/or treatment for substance abuse?: No Suicide prevention information given to non-admitted patients: Not applicable  Risk to Others within the past 6 months Homicidal Ideation: No Does patient have any lifetime risk of violence toward others beyond the six months prior to admission? : No Thoughts  of Harm to Others: No Current Homicidal Intent: No Current Homicidal Plan: No Access to Homicidal Means: No Identified Victim: N/A History of harm to others?: No Assessment of Violence: None Noted Violent Behavior Description: No  violent behaviors observed. Pt is calm and cooperative at this time.  Does patient have access to weapons?: No Criminal Charges Pending?: No Does patient have a court date: No Is patient on probation?: No  Psychosis Hallucinations: Auditory Delusions: None noted  Mental Status Report Appearance/Hygiene: Unremarkable Eye Contact: Poor Motor Activity: Unable to assess Speech: Soft (Pt talking to herself) Level of Consciousness: Drowsy Mood:  (Unable to assess) Affect: Unable to Assess Anxiety Level:  (unable to assess) Thought Processes: Unable to Assess Judgement: Unable to Assess Orientation: Unable to assess Obsessive Compulsive Thoughts/Behaviors: Unable to Assess  Cognitive Functioning Concentration: Unable to Assess Memory: Unable to Assess IQ: Average Insight: Unable to Assess Impulse Control: Unable to Assess Appetite:  (unable to assess)  ADLScreening [facility] Assessment Services) Patient's cognitive ability adequate to safely complete daily activities?: Yes Patient able to express need for assistance with ADLs?: Yes Independently performs ADLs?: Yes (appropriate for developmental age)  Prior Inpatient Therapy Prior Inpatient Therapy: Yes Prior Therapy Dates: 9/12, 5/17 Prior Therapy Facilty/Provider(s): [facility], [facility]  Reason for Treatment: delusions, hallucinations   Prior Outpatient Therapy Prior Outpatient Therapy: No Prior Therapy Dates: unknown Prior Therapy Facilty/Provider(s): unknown Reason for Treatment: unknown Does patient have an ACCT team?: Unknown Does patient have Intensive In-House Services?  : No Does patient have [facility] services? : Unknown Does patient have P4CC services?: Unknown  ADL Screening (condition at time of admission) Patient's cognitive ability adequate to safely complete daily activities?: Yes Is the patient deaf or have difficulty hearing?: No Does the patient have difficulty seeing, even when wearing glasses/contacts?:  No Does the patient have difficulty concentrating, remembering, or making decisions?: No Patient able to express need for assistance with ADLs?: Yes Does the patient have difficulty dressing or bathing?: No Independently performs ADLs?: Yes (appropriate for developmental age) Does the patient have difficulty walking or climbing stairs?: No       Abuse/Neglect Assessment (Assessment to be complete while patient is alone) Physical Abuse: Denies Verbal Abuse: Denies Sexual Abuse: Denies Exploitation of patient/patient's resources: Denies Self-Neglect: Denies     Merchant navy officer (For Healthcare) Does patient have an advance directive?: No Would patient like information on creating an advanced directive?: No - patient declined information    Additional Information 1:1 In Past 12 Months?: No CIRT Risk: No Elopement Risk: No Does patient have medical clearance?: Yes     Disposition:  Disposition Initial Assessment Completed for this Encounter: Yes Disposition of Patient: Other dispositions Other disposition(s): Other (Comment) (AM Psych eval )  Coe Angelos S 05/09/2016 3:58 AM

## 2016-05-09 NOTE — BHH Suicide Risk Assessment (Signed)
Suicide Risk Assessment  Discharge Assessment   [facility] Discharge Suicide Risk Assessment   Principal Problem: Bipolar 2 disorder [facility]) Discharge Diagnoses:  Patient Active Problem List   Diagnosis Date Noted  . Bipolar 2 disorder (HCC) [F31.81] 05/09/2016    Priority: High  . Intermittent explosive disorder [F63.81] 04/27/2016    Priority: High  . Psychoses [F29]   . Abscess of finger of left hand [L02.512] 05/19/2013    Total Time spent with patient: 45 minutes  Musculoskeletal: Strength & Muscle Tone: within normal limits Gait & Station: normal Patient leans: N/A  Psychiatric Specialty Exam: Physical Exam  Constitutional: She is oriented to person, place, and time. She appears well-developed and well-nourished.  HENT:  Head: Normocephalic.  Neck: Normal range of motion.  Respiratory: Effort normal.  Musculoskeletal: Normal range of motion.  Neurological: She is alert and oriented to person, place, and time.  Skin: Skin is warm and dry.  Psychiatric: Her speech is normal and behavior is normal. Judgment and thought content normal. Her mood appears anxious. Cognition and memory are normal.    Review of Systems  Constitutional: Negative.   HENT: Negative.   Eyes: Negative.   Respiratory: Negative.   Cardiovascular: Negative.   Gastrointestinal: Negative.   Genitourinary: Negative.   Musculoskeletal: Negative.   Skin: Negative.   Neurological: Negative.   Endo/Heme/Allergies: Negative.   Psychiatric/Behavioral: The patient is nervous/anxious.     Blood pressure 126/87, pulse 85, temperature 98.7 F (37.1 C), temperature source Oral, resp. rate 19, last menstrual period 04/20/2016, SpO2 95 %.There is no weight on file to calculate BMI.  General Appearance: Casual  Eye Contact:  Good  Speech:  Normal Rate  Volume:  Normal  Mood:  Anxious, mild  Affect:  Congruent  Thought Process:  Coherent  Orientation:  Full (Time, Place, and Person)  Thought Content:  WDL   Suicidal Thoughts:  No  Homicidal Thoughts:  No  Memory:  Immediate;   Good Recent;   Good Remote;   Good  Judgement:  Fair  Insight:  Fair  Psychomotor Activity:  Normal  Concentration:  Concentration: Good and Attention Span: Good  Recall:  Good  Fund of Knowledge:  Good  Language:  Good  Akathisia:  No  Handed:  Right  AIMS (if indicated):     Assets:  Leisure Time Physical Health Resilience  ADL's:  Intact  Cognition:  WNL  Sleep:      Mental Status Per Nursing Assessment::   On Admission:   auditory hallucinations  Demographic Factors:  NA  Loss Factors: NA  Historical Factors: NA  Risk Reduction Factors:   Sense of responsibility to family and Positive therapeutic relationship  Continued Clinical Symptoms:  Anxiety, mild  Cognitive Features That Contribute To Risk:  None    Suicide Risk:  Minimal: No identifiable suicidal ideation.  Patients presenting with no risk factors but with morbid ruminations; may be classified as minimal risk based on the severity of the depressive symptoms    Plan Of Care/Follow-up recommendations:  Activity:  as tolerated Diet:  heart healthy diet  Teya Otterson, NP 05/09/2016, 11:33 AM

## 2016-05-09 NOTE — ED Notes (Signed)
Psych MD at bedside

## 2016-05-09 NOTE — BH Assessment (Signed)
BHH Assessment Progress Note  Per Thedore Mins, MD, this pt does not require psychiatric hospitalization at this time.  She is to be discharged from [facility] with outpatient referral information.  Discharge instructions advise pt to follow up with [facility].  Pt's nurse has been notified.  Doylene Canning, MA Triage Specialist [phone]

## 2016-05-09 NOTE — ED Provider Notes (Signed)
CSN: [id number]     Arrival date & time 05/08/16  2248 History   First MD Initiated Contact with Patient 05/09/16 0243     Chief Complaint  Patient presents with  . Hallucinations     (Consider location/radiation/quality/duration/timing/severity/associated sxs/prior Treatment) HPI Comments: Patient with a history of schizophrenia presents with complaint of hearing voices. They are not command voices and she denies SI/HI. No visual hallucinations. She states she feels she is mentally unstable and that the medications given to her on a recent admission are not working.   The history is provided by the patient. No language interpreter was used.    Past Medical History  Diagnosis Date  . Schizophrenia (HCC)   . Borderline diabetes    Past Surgical History  Procedure Laterality Date  . Cesarean section     Family History  Problem Relation Age of Onset  . Hypertension Other   . Diabetes Other   . CAD Other    Social History  Substance Use Topics  . Smoking status: Never Smoker   . Smokeless tobacco: None  . Alcohol Use: Yes     Comment: occ   OB History    No data available     Review of Systems  Constitutional: Negative for fever and chills.  HENT: Negative.   Respiratory: Negative.   Cardiovascular: Negative.   Gastrointestinal: Negative.   Musculoskeletal: Negative.   Skin: Negative.   Neurological: Negative.   Psychiatric/Behavioral: Positive for hallucinations. Negative for suicidal ideas.      Allergies  Review of patient's allergies indicates no known allergies.  Home Medications   Prior to Admission medications   Medication Sig Start Date End Date Taking? Authorizing Provider  buPROPion (WELLBUTRIN XL) 150 MG 24 hr tablet Take 150 mg by mouth daily. 05/05/16 06/04/16  Historical Provider, MD  fluticasone (FLONASE) 50 MCG/ACT nasal spray Place 1 spray into both nostrils daily as needed for allergies.  02/18/16   Historical Provider, MD  moxifloxacin  (VIGAMOX) 0.5 % ophthalmic solution Place 1 drop into the right eye 6 (six) times daily. 04/04/15   Blake Divine, MD  phentermine 37.5 MG capsule Take 37.5 mg by mouth daily.    Historical Provider, MD   BP 132/75 mmHg  Pulse 90  Temp(Src) 98.7 F (37.1 C) (Oral)  Resp 18  SpO2 99%  LMP 04/20/2016 (Approximate) Physical Exam  Constitutional: She appears well-developed and well-nourished.  HENT:  Head: Normocephalic.  Neck: Normal range of motion. Neck supple.  Cardiovascular: Normal rate and regular rhythm.   Pulmonary/Chest: Effort normal and breath sounds normal.  Abdominal: Soft. Bowel sounds are normal. There is no tenderness. There is no rebound and no guarding.  Musculoskeletal: Normal range of motion.  Neurological: She is alert. No cranial nerve deficit.  Skin: Skin is warm and dry. No rash noted.  Psychiatric: Her speech is normal. Her affect is labile. She is actively hallucinating. She expresses no suicidal ideation. She expresses no suicidal plans.    ED Course  Procedures (including critical care time) Labs Review Labs Reviewed  COMPREHENSIVE METABOLIC PANEL - Abnormal; Notable for the following:    Potassium 3.0 (*)    Glucose, Bld 160 (*)    All other components within normal limits  ACETAMINOPHEN LEVEL - Abnormal; Notable for the following:    Acetaminophen (Tylenol), Serum <10 (*)    All other components within normal limits  CBC - Abnormal; Notable for the following:    WBC 10.8 (*)  Hemoglobin 10.6 (*)    HCT 31.1 (*)    MCV 74.0 (*)    MCH 25.2 (*)    All other components within normal limits  ETHANOL  SALICYLATE LEVEL  URINE RAPID DRUG SCREEN, HOSP PERFORMED  I-STAT BETA HCG BLOOD, ED ([facility], [facility], [facility] ONLY)    Imaging Review No results found. I have personally reviewed and evaluated these images and lab results as part of my medical decision-making.   EKG Interpretation None      MDM   Final diagnoses:  None    1. Auditory  hallucinations 2. History of schizophrenia  Patient denies SI/HI but endorses auditory hallucinations and feeling mentally unstable. TTS consultation to determine disposition.    Elpidio Anis, PA-C 05/09/16 4540  Paula Libra, MD 05/09/16 [phone]

## 2016-05-09 NOTE — Consult Note (Signed)
[place] [facility] Consult   Reason for Consult:  Hallucinations Referring Physician:  EDP Patient Identification: Kristen [name] MRN:  [id number] Principal Diagnosis: Bipolar 2 disorder [facility]) Diagnosis:   Patient Active Problem List   Diagnosis Date Noted  . Bipolar 2 disorder ([place]) [F31.81] 05/09/2016    Priority: High  . Intermittent explosive disorder [F63.81] 04/27/2016    Priority: High  . Psychoses [F29]   . Abscess of finger of left hand [L02.512] 05/19/2013    Total Time spent with patient: 45 minutes  Subjective:   Kristen [name] is a 35 y.o. female patient does not warrant admission.  HPI:  35 yo female who presented to the ED with auditory hallucinations, just left [facility] last week but did not go to her follow-up appointment.  On assessment, patient complains of not getting "the rest like I should." Denies homicidal/suicidal ideations, hallucinations, and alcohol/drug abuse.  She claims her family sent her here to "heal me."  Her biggest issue is being homeless even though she denies this to the providers.  She denies hallucinations to the nurse prior to discharge but discharged and checked back in for auditory hallucinations, homeless resources and bus pass provided.  Past Psychiatric History: bipolar disorder  Risk to Self: Suicidal Ideation: No Suicidal Intent: No Is patient at risk for suicide?: No Suicidal Plan?: No Access to Means: No What has been your use of drugs/alcohol within the last 12 months?: Pt denies  How many times?: 0 Other Self Harm Risks: pt denies Triggers for Past Attempts: Unknown Intentional Self Injurious Behavior: None Risk to Others: Homicidal Ideation: No Thoughts of Harm to Others: No Current Homicidal Intent: No Current Homicidal Plan: No Access to Homicidal Means: No Identified Victim: N/A History of harm to others?: No Assessment of Violence: None Noted Violent Behavior Description: No violent behaviors observed. Pt is  calm and cooperative at this time.  Does patient have access to weapons?: No Criminal Charges Pending?: No Does patient have a court date: No Prior Inpatient Therapy: Prior Inpatient Therapy: Yes Prior Therapy Dates: 9/12, 5/17 Prior Therapy Facilty/Provider(s): [facility], [facility]  Reason for Treatment: delusions, hallucinations  Prior Outpatient Therapy: Prior Outpatient Therapy: No Prior Therapy Dates: unknown Prior Therapy Facilty/Provider(s): unknown Reason for Treatment: unknown Does patient have an ACCT team?: Unknown Does patient have Intensive In-House Services?  : No Does patient have [facility] services? : Unknown Does patient have P4CC services?: Unknown  Past Medical History:  Past Medical History  Diagnosis Date  . Schizophrenia ([place])   . Borderline diabetes     Past Surgical History  Procedure Laterality Date  . Cesarean section     Family History:  Family History  Problem Relation Age of Onset  . Hypertension Other   . Diabetes Other   . CAD Other    Family Psychiatric  History: none Social History:  History  Alcohol Use  . Yes    Comment: occ     History  Drug Use No    Social History   Social History  . Marital Status: Single    Spouse Name: N/A  . Number of Children: N/A  . Years of Education: N/A   Social History Main Topics  . Smoking status: Never Smoker   . Smokeless tobacco: None  . Alcohol Use: Yes     Comment: occ  . Drug Use: No  . Sexual Activity: Not Asked   Other Topics Concern  . None   Social History Narrative   Additional  Social History:    Allergies:  No Known Allergies  Labs:  Results for orders placed or performed during the hospital encounter of 05/08/16 (from the past 48 hour(s))  Rapid urine drug screen (hospital performed)     Status: None   Collection Time: 05/08/16 11:44 PM  Result Value Ref Range   Opiates NONE DETECTED NONE DETECTED   Cocaine NONE DETECTED NONE DETECTED   Benzodiazepines NONE DETECTED  NONE DETECTED   Amphetamines NONE DETECTED NONE DETECTED   Tetrahydrocannabinol NONE DETECTED NONE DETECTED   Barbiturates NONE DETECTED NONE DETECTED    Comment:        DRUG SCREEN FOR MEDICAL PURPOSES ONLY.  IF CONFIRMATION IS NEEDED FOR ANY PURPOSE, NOTIFY LAB WITHIN 5 DAYS.        LOWEST DETECTABLE LIMITS FOR URINE DRUG SCREEN Drug Class       Cutoff (ng/mL) Amphetamine      1000 Barbiturate      200 Benzodiazepine   086 Tricyclics       578 Opiates          300 Cocaine          300 THC              50   Comprehensive metabolic panel     Status: Abnormal   Collection Time: 05/08/16 11:57 PM  Result Value Ref Range   Sodium 135 135 - 145 mmol/L   Potassium 3.0 (L) 3.5 - 5.1 mmol/L   Chloride 106 101 - 111 mmol/L   CO2 24 22 - 32 mmol/L   Glucose, Bld 160 (H) 65 - 99 mg/dL   BUN 11 6 - 20 mg/dL   Creatinine, Ser 0.70 0.44 - 1.00 mg/dL   Calcium 9.2 8.9 - 10.3 mg/dL   Total Protein 7.1 6.5 - 8.1 g/dL   Albumin 3.9 3.5 - 5.0 g/dL   AST 21 15 - 41 U/L   ALT 17 14 - 54 U/L   Alkaline Phosphatase 78 38 - 126 U/L   Total Bilirubin 0.7 0.3 - 1.2 mg/dL   GFR calc non Af Amer >60 >60 mL/min   GFR calc Af Amer >60 >60 mL/min    Comment: (NOTE) The eGFR has been calculated using the CKD EPI equation. This calculation has not been validated in all clinical situations. eGFR's persistently <60 mL/min signify possible Chronic Kidney Disease.    Anion gap 5 5 - 15  cbc     Status: Abnormal   Collection Time: 05/08/16 11:57 PM  Result Value Ref Range   WBC 10.8 (H) 4.0 - 10.5 K/uL   RBC 4.20 3.87 - 5.11 MIL/uL   Hemoglobin 10.6 (L) 12.0 - 15.0 g/dL   HCT 31.1 (L) 36.0 - 46.0 %   MCV 74.0 (L) 78.0 - 100.0 fL   MCH 25.2 (L) 26.0 - 34.0 pg   MCHC 34.1 30.0 - 36.0 g/dL   RDW 14.5 11.5 - 15.5 %   Platelets 294 150 - 400 K/uL  Ethanol     Status: None   Collection Time: 05/08/16 11:58 PM  Result Value Ref Range   Alcohol, Ethyl (B) <5 <5 mg/dL    Comment:        LOWEST  DETECTABLE LIMIT FOR SERUM ALCOHOL IS 5 mg/dL FOR MEDICAL PURPOSES ONLY   Salicylate level     Status: None   Collection Time: 05/08/16 11:58 PM  Result Value Ref Range   Salicylate Lvl <4.6 2.8 - 30.0 mg/dL  Acetaminophen level     Status: Abnormal   Collection Time: 05/08/16 11:58 PM  Result Value Ref Range   Acetaminophen (Tylenol), Serum <10 (L) 10 - 30 ug/mL    Comment:        THERAPEUTIC CONCENTRATIONS VARY SIGNIFICANTLY. A RANGE OF 10-30 ug/mL MAY BE AN EFFECTIVE CONCENTRATION FOR MANY PATIENTS. HOWEVER, SOME ARE BEST TREATED AT CONCENTRATIONS OUTSIDE THIS RANGE. ACETAMINOPHEN CONCENTRATIONS >150 ug/mL AT 4 HOURS AFTER INGESTION AND >50 ug/mL AT 12 HOURS AFTER INGESTION ARE OFTEN ASSOCIATED WITH TOXIC REACTIONS.   I-Stat beta hCG blood, ED     Status: None   Collection Time: 05/09/16 12:06 AM  Result Value Ref Range   I-stat hCG, quantitative <5.0 <5 mIU/mL   Comment 3            Comment:   GEST. AGE      CONC.  (mIU/mL)   <=1 WEEK        5 - 50     2 WEEKS       50 - 500     3 WEEKS       100 - 10,000     4 WEEKS     1,000 - 30,000        FEMALE AND NON-PREGNANT FEMALE:     LESS THAN 5 mIU/mL     No current facility-administered medications for this encounter.   Current Outpatient Prescriptions  Medication Sig Dispense Refill  . buPROPion (WELLBUTRIN XL) 150 MG 24 hr tablet Take 150 mg by mouth daily.    . fluticasone (FLONASE) 50 MCG/ACT nasal spray Place 1 spray into both nostrils daily as needed for allergies.     . phentermine 37.5 MG capsule Take 37.5 mg by mouth daily.    Marland Kitchen moxifloxacin (VIGAMOX) 0.5 % ophthalmic solution Place 1 drop into the right eye 6 (six) times daily. (Patient not taking: Reported on 05/09/2016) 3 mL 0    Musculoskeletal: Strength & Muscle Tone: within normal limits Gait & Station: normal Patient leans: N/A  Psychiatric Specialty Exam: Physical Exam  Constitutional: She is oriented to person, place, and time. She appears  well-developed and well-nourished.  HENT:  Head: Normocephalic.  Neck: Normal range of motion.  Respiratory: Effort normal.  Musculoskeletal: Normal range of motion.  Neurological: She is alert and oriented to person, place, and time.  Skin: Skin is warm and dry.  Psychiatric: Her speech is normal and behavior is normal. Judgment and thought content normal. Her mood appears anxious. Cognition and memory are normal.    Review of Systems  Constitutional: Negative.   HENT: Negative.   Eyes: Negative.   Respiratory: Negative.   Cardiovascular: Negative.   Gastrointestinal: Negative.   Genitourinary: Negative.   Musculoskeletal: Negative.   Skin: Negative.   Neurological: Negative.   Endo/Heme/Allergies: Negative.   Psychiatric/Behavioral: The patient is nervous/anxious.     Blood pressure 126/87, pulse 85, temperature 98.7 F (37.1 C), temperature source Oral, resp. rate 19, last menstrual period 04/20/2016, SpO2 95 %.There is no weight on file to calculate BMI.  General Appearance: Casual  Eye Contact:  Good  Speech:  Normal Rate  Volume:  Normal  Mood:  Anxious, mild  Affect:  Congruent  Thought Process:  Coherent  Orientation:  Full (Time, Place, and Person)  Thought Content:  WDL  Suicidal Thoughts:  No  Homicidal Thoughts:  No  Memory:  Immediate;   Good Recent;   Good Remote;   Good  Judgement:  Fair  Insight:  Fair  Psychomotor Activity:  Normal  Concentration:  Concentration: Good and Attention Span: Good  Recall:  Good  Fund of Knowledge:  Good  Language:  Good  Akathisia:  No  Handed:  Right  AIMS (if indicated):     Assets:  Leisure Time Physical Health Resilience  ADL's:  Intact  Cognition:  WNL  Sleep:        Treatment Plan Summary: Daily contact with patient to assess and evaluate symptoms and progress in treatment, Medication management and Plan bipolar II disorder:  -Crisis stabilization -Medication management:  Restarted her Wellbutrin 150 mg  daily for depression, currently on Invega injectable per patient -Individual counseling -Rx and homeless resources provided  Disposition: No evidence of imminent risk to self or others at present.    Waylan Boga, NP 05/09/2016 9:58 AM Patient seen face-to-face for psychiatric evaluation, chart reviewed and case discussed with the physician extender and developed treatment plan. Reviewed the information documented and agree with the treatment plan. Corena Pilgrim, MD

## 2016-05-09 NOTE — Discharge Instructions (Signed)
Bipolar Disorder °Bipolar disorder is a mental illness. The term bipolar disorder actually is used to describe a group of disorders that all share varying degrees of emotional highs and lows that can interfere with daily functioning, such as work, school, or relationships. Bipolar disorder also can lead to drug abuse, hospitalization, and suicide. °The emotional highs of bipolar disorder are periods of elation or irritability and high energy. These highs can range from a mild form (hypomania) to a severe form (mania). People experiencing episodes of hypomania may appear energetic, excitable, and highly productive. People experiencing mania may behave impulsively or erratically. They often make poor decisions. They may have difficulty sleeping. The most severe episodes of mania can involve having very distorted beliefs or perceptions about the world and seeing or hearing things that are not real (psychotic delusions and hallucinations).  °The emotional lows of bipolar disorder (depression) also can range from mild to severe. Severe episodes of bipolar depression can involve psychotic delusions and hallucinations. °Sometimes people with bipolar disorder experience a state of mixed mood. Symptoms of hypomania or mania and depression are both present during this mixed-mood episode. °SIGNS AND SYMPTOMS °There are signs and symptoms of the episodes of hypomania and mania as well as the episodes of depression. The signs and symptoms of hypomania and mania are similar but vary in severity. They include: °· Inflated self-esteem or feeling of increased self-confidence. °· Decreased need for sleep. °· Unusual talkativeness (rapid or pressured speech) or the feeling of a need to keep talking. °· Sensation of racing thoughts or constant talking, with quick shifts between topics that may or may not be related (flight of ideas). °· Decreased ability to focus or concentrate. °· Increased purposeful activity, such as work, studies,  or social activity, or nonproductive activity, such as pacing, squirming and fidgeting, or finger and toe tapping. °· Impulsive behavior and use of poor judgment, resulting in high-risk activities, such as having unprotected sex or spending excessive amounts of money. °Signs and symptoms of depression include the following:  °· Feelings of sadness, hopelessness, or helplessness. °· Frequent or uncontrollable episodes of crying. °· Lack of feeling anything or caring about anything. °· Difficulty sleeping or sleeping too much.  °· Inability to enjoy the things you used to enjoy.   °· Desire to be alone all the time.   °· Feelings of guilt or worthlessness.  °· Lack of energy or motivation.   °· Difficulty concentrating, remembering, or making decisions.  °· Change in appetite or weight beyond normal fluctuations. °· Thoughts of death or the desire to harm yourself. °DIAGNOSIS  °Bipolar disorder is diagnosed through an assessment by your caregiver. Your caregiver will ask questions about your emotional episodes. There are two main types of bipolar disorder. People with type I bipolar disorder have manic episodes with or without depressive episodes. People with type II bipolar disorder have hypomanic episodes and major depressive episodes, which are more serious than mild depression. The type of bipolar disorder you have can make an important difference in how your illness is monitored and treated. °Your caregiver may ask questions about your medical history and use of alcohol or drugs, including prescription medication. Certain medical conditions and substances also can cause emotional highs and lows that resemble bipolar disorder (secondary bipolar disorder).  °TREATMENT  °Bipolar disorder is a long-term illness. It is best controlled with continuous treatment rather than treatment only when symptoms occur. The following treatments can be prescribed for bipolar disorders: °· Medication--Medication can be prescribed by  a doctor that   is an expert in treating mental disorders (psychiatrists). Medications called mood stabilizers are usually prescribed to help control the illness. Other medications are sometimes added if symptoms of mania, depression, or psychotic delusions and hallucinations occur despite the use of a mood stabilizer.  Talk therapy--Some forms of talk therapy are helpful in providing support, education, and guidance. A combination of medication and talk therapy is best for managing the disorder over time. A procedure in which electricity is applied to your brain through your scalp (electroconvulsive therapy) is used in cases of severe mania when medication and talk therapy do not work or work too slowly.   This information is not intended to replace advice given to you by your health care provider. Make sure you discuss any questions you have with your health care provider.   Document Released: 02/26/2001 Document Revised: 12/11/2014 Document Reviewed: 12/16/2012 Elsevier Interactive Patient Education [employer]   For your ongoing behavioral health needs:  You are advised to follow up with [facility]. New and returning patients are seen at their walk-in clinic. Walk-in hours are Monday - Friday from 8:00 am - 3:00 pm. Walk-in patients are seen on a first come, first served basis. Try to arrive as early as possible for he best chance of being seen the same day:   [facility]  [address]  [place], [place] [address]  [phone]

## 2017-11-08 ENCOUNTER — Emergency Department (HOSPITAL_COMMUNITY)
Admission: EM | Admit: 2017-11-08 | Discharge: 2017-11-08 | Disposition: A | Discharge status: Home / Self Care | Payer: Self-pay | Attending: Emergency Medicine | Admitting: Emergency Medicine

## 2017-11-08 ENCOUNTER — Other Ambulatory Visit: Payer: Self-pay

## 2017-11-08 ENCOUNTER — Encounter (HOSPITAL_COMMUNITY): Payer: Self-pay

## 2017-11-08 DIAGNOSIS — J069 Acute upper respiratory infection, unspecified: Secondary | ICD-10-CM

## 2017-11-08 DIAGNOSIS — Z79899 Other long term (current) drug therapy: Secondary | ICD-10-CM | POA: Insufficient documentation

## 2017-11-08 DIAGNOSIS — H1033 Unspecified acute conjunctivitis, bilateral: Secondary | ICD-10-CM

## 2017-11-08 DIAGNOSIS — H1089 Other conjunctivitis: Secondary | ICD-10-CM | POA: Insufficient documentation

## 2017-11-08 MED ORDER — BENZONATATE 100 MG PO CAPS: 100.0000 mg | ORAL_CAPSULE | Freq: Three times a day (TID) | ORAL | 0 refills | Status: DC

## 2017-11-08 MED ORDER — ERYTHROMYCIN 5 MG/GM OP OINT
TOPICAL_OINTMENT | Freq: Once | OPHTHALMIC | Status: AC
  Administered 2017-11-08: 2 via OPHTHALMIC
  Filled 2017-11-08: qty 3.5

## 2017-11-08 NOTE — ED Triage Notes (Signed)
Patient reports blood shot eyes for "the past couple days" and "crusty eyes this morning."  Patient also reports cold like symptoms including nasal congestion, head congestion, watery eyes, headache, and lethargy.

## 2017-11-08 NOTE — Discharge Instructions (Signed)
Use the eye ointment 3 times a day for the next 5 to 7 days.follow up with your doctor. Return here as needed.

## 2017-11-08 NOTE — ED Provider Notes (Signed)
[facility]-EMERGENCY DEPT Provider Note   CSN: [id number] Arrival date & time: 11/08/17  1624     History   Chief Complaint Chief Complaint  Patient presents with  . Nasal Congestion  . Eye Drainage    HPI Kristen [name] is a 36 y.o. female who presents to the ED with itchy, watery, red eyes with crusting in the morning that started 2 days ago. Patient also c/o nasal congestion, post nasal drainage, head congestion and feeling tired.   HPI  Past Medical History:  Diagnosis Date  . Borderline diabetes   . Schizophrenia [facility])     Patient Active Problem List   Diagnosis Date Noted  . Bipolar 2 disorder (HCC) 05/09/2016  . Intermittent explosive disorder 04/27/2016  . Psychoses (HCC)   . Abscess of finger of left hand 05/19/2013    Past Surgical History:  Procedure Laterality Date  . CESAREAN SECTION      OB History    No data available       Home Medications    Prior to Admission medications   Medication Sig Start Date End Date Taking? Authorizing Provider  benzonatate (TESSALON) 100 MG capsule Take 1 capsule (100 mg total) by mouth every 8 (eight) hours. 11/08/17   Janne Napoleon, NP  buPROPion (WELLBUTRIN XL) 150 MG 24 hr tablet Take 150 mg by mouth daily. 05/05/16 06/04/16  [provider]  fluticasone (FLONASE) 50 MCG/ACT nasal spray Place 1 spray into both nostrils daily as needed for allergies.  02/18/16   [provider]  moxifloxacin (VIGAMOX) 0.5 % ophthalmic solution Place 1 drop into the right eye 6 (six) times daily. Patient not taking: Reported on 05/09/2016 04/04/15   Blake Divine, MD  phentermine 37.5 MG capsule Take 37.5 mg by mouth daily.    [provider]    Family History Family History  Problem Relation Age of Onset  . Hypertension Other   . Diabetes Other   . CAD Other     Social History Social History   Tobacco Use  . Smoking status: Never Smoker  Substance Use Topics  . Alcohol use: Yes     Comment: occ  . Drug use: No     Allergies   Patient has no known allergies.   Review of Systems Review of Systems  Constitutional: Negative for chills and fever.  HENT: Positive for congestion and rhinorrhea.   Eyes: Positive for photophobia, discharge, redness and itching.  Respiratory: Negative for shortness of breath.   Gastrointestinal: Negative for nausea and vomiting.  Musculoskeletal: Negative for neck pain.  Skin: Negative for rash.  Neurological: Negative for syncope and headaches.  Psychiatric/Behavioral: Negative for confusion.     Physical Exam Updated Vital Signs BP (!) 144/87 (BP Location: Right Arm)   Pulse 91   Temp 98.5 F (36.9 C)   Resp 18   LMP 11/01/2017   SpO2 100%   Physical Exam  Constitutional: She appears well-developed and well-nourished. No distress.  HENT:  Right Ear: Tympanic membrane normal.  Left Ear: Tympanic membrane normal.  Nose: Mucosal edema and rhinorrhea present.  Mouth/Throat: Uvula is midline, oropharynx is clear and moist and mucous membranes are normal.  Eyes: EOM are normal. Pupils are equal, round, and reactive to light. Right eye exhibits discharge. Left eye exhibits discharge. Right conjunctiva is injected. Left conjunctiva is injected.  Neck: Neck supple.  Cardiovascular: Normal rate and regular rhythm.  Pulmonary/Chest: Effort normal and breath sounds normal.  Abdominal:  Soft. There is no tenderness.  Musculoskeletal: Normal range of motion.  Neurological: She is alert.  Skin: Skin is warm and dry.  Psychiatric: She has a normal mood and affect. Her behavior is normal.  Nursing note and vitals reviewed.    ED Treatments / Results  Labs (all labs ordered are listed, but only abnormal results are displayed) Labs Reviewed - No data to display  Radiology No results found.  Procedures Procedures (including critical care time)  Medications Ordered in ED Medications  erythromycin ophthalmic ointment (2  application Both Eyes Given 11/08/17 1747)     Initial Impression / Assessment and Plan / ED Course  I have reviewed the triage vital signs and the nursing notes. Pt CXR negative for acute infiltrate. Patients symptoms are consistent with URI, likely viral etiology. Discussed that antibiotics are not indicated for viral infections. Pt will be discharged with symptomatic treatment.  Verbalizes understanding and is agreeable with plan. Pt is hemodynamically stable & in NAD prior to dc.  Joneen CarawayJozelle Ihde presents with symptoms consistent with bacterial conjunctivitis.  Purulent discharge exam.  No corneal abrasions, entrapment, consensual photophobia, or dendritic staining with fluorescein study.  Presentation non-concerning for iritis, corneal abrasions, or HSV.  No evidence of preseptal or orbital cellulitis.  Pt is not a contact lens wearer.  Patient will be given erythromycin ophthalmic.  Personal hygiene and frequent handwashing discussed.  Patient advised to followup with ophthalmologist for reevaluation in several days..  Patient verbalizes understanding and is agreeable with discharge.     Final Clinical Impressions(s) / ED Diagnoses   Final diagnoses:  Acute bacterial conjunctivitis of both eyes  URI, acute    ED Discharge Orders        Ordered    benzonatate (TESSALON) 100 MG capsule  Every 8 hours     11/08/17 1719       Damian Leavelleese, GonvickHope M, NP 11/08/17 2251    Melene PlanFloyd, Dan, DO 11/08/17 2306

## 2017-12-16 ENCOUNTER — Encounter (HOSPITAL_COMMUNITY): Payer: Self-pay

## 2017-12-16 ENCOUNTER — Emergency Department (HOSPITAL_COMMUNITY)
Admission: EM | Admit: 2017-12-16 | Discharge: 2017-12-16 | Disposition: A | Discharge status: Home / Self Care | Payer: Medicaid Other | Attending: Emergency Medicine | Admitting: Emergency Medicine

## 2017-12-16 ENCOUNTER — Other Ambulatory Visit: Payer: Self-pay

## 2017-12-16 DIAGNOSIS — Z79899 Other long term (current) drug therapy: Secondary | ICD-10-CM | POA: Insufficient documentation

## 2017-12-16 DIAGNOSIS — I10 Essential (primary) hypertension: Secondary | ICD-10-CM | POA: Insufficient documentation

## 2017-12-16 DIAGNOSIS — Z0130 Encounter for examination of blood pressure without abnormal findings: Secondary | ICD-10-CM

## 2017-12-16 NOTE — ED Triage Notes (Signed)
Pt reports she needs to check due to her blood pressure not being normal and she doesn't feel right. Reports she took bp meds before checking in

## 2017-12-16 NOTE — Discharge Instructions (Addendum)
You were seen in ED to check Blood pressure. Your blood pressure was within normal range. Please continue to follow with your regular doctor.

## 2017-12-16 NOTE — ED Notes (Signed)
Patient refused EKG and "just wants to go home".  Dr. Jodi Mourning notified

## 2017-12-16 NOTE — ED Provider Notes (Signed)
[facility] EMERGENCY DEPARTMENT Provider Note   CSN: [id number] Arrival date & time: 12/16/17  2131     History   Chief Complaint Chief Complaint  Patient presents with  . Hypertension    HPI Kristen [name] is a 37 y.o. female who presents to ED with her daughter, however is concerned about her blood pressure and would like [place] to check it. She reports she was seen by her regular doctor a few days ago and BP was elevated at 190/104 at that time, and she was concerned that it may not have improved. She was feeling a slight headache prompting her to check in to the ED for a BP check.  Patient reports mild headache, no vision changes. 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    Past Medical History:  Diagnosis Date  . Borderline diabetes   . Schizophrenia [facility])     Patient Active Problem List   Diagnosis Date Noted  . Bipolar 2 disorder (HCC) 05/09/2016  . Intermittent explosive disorder 04/27/2016  . Psychoses (HCC)   . Abscess of finger of left hand 05/19/2013    Past Surgical History:  Procedure Laterality Date  . CESAREAN SECTION      OB History    No data available       Home Medications    Prior to Admission medications   Medication Sig Start Date End Date Taking? Authorizing Provider  buPROPion (WELLBUTRIN XL) 150 MG 24 hr tablet Take 150 mg by mouth daily. 05/05/16 12/16/24 Yes [provider]  phentermine 37.5 MG capsule Take 37.5 mg by mouth daily.   Yes [provider]  benzonatate (TESSALON) 100 MG capsule Take 1 capsule (100 mg total) by mouth every 8 (eight) hours. Patient not taking: Reported on 12/16/2017 11/08/17   Janne Napoleon, NP  moxifloxacin (VIGAMOX) 0.5 % ophthalmic solution Place 1 drop into the right eye 6 (six) times daily. Patient not taking: Reported on 05/09/2016 04/04/15   Blake Divine,  MD    Family History Family History  Problem Relation Age of Onset  . Hypertension Other   . Diabetes Other   . CAD Other     Social History Social History   Tobacco Use  . Smoking status: Never Smoker  Substance Use Topics  . Alcohol use: Yes    Comment: occ  . Drug use: No     Allergies   Patient has no known allergies.   Review of Systems Review of Systems   Physical Exam Updated Vital Signs BP (!) 114/48 (BP Location: Right Arm)   Pulse 88   Temp 97.8 F (36.6 C) (Oral)   Resp 18   SpO2 97%   Physical Exam  Constitutional: She is oriented to person, place, and time. She appears well-developed and well-nourished.  HENT:  Head: Normocephalic and atraumatic.  Neck: Neck supple.  Cardiovascular: Normal rate and regular rhythm.  +tenderness to palpation over the chest wall  Pulmonary/Chest: Effort normal and breath sounds normal. She has no wheezes. She has no rales. She exhibits no tenderness.  Abdominal: Soft.  Neurological: She is alert and oriented to person, place, and time.  Skin: Skin is warm and dry.     ED Treatments / Results  Labs (all labs ordered are listed, but only abnormal results are displayed) Labs Reviewed - No data to display  EKG  EKG Interpretation None       Radiology No results found.  Procedures Procedures (including critical care time)  Medications Ordered in ED Medications - No data to display   Initial Impression / Assessment and Plan / ED Course  I have reviewed the triage vital signs and the nursing notes.  Pertinent labs & imaging results that were available during my care of the patient were reviewed by me and considered in my medical decision making (see chart for details).    37 year old female with recently diagnosed HTN now on HCTZ checked in to the ED for a blood pressure check. She reports that she was feeling "a little off" with a headache similar to the one she had when her BP was elevated  previously, and so she wanted to make sure her BP was not elevated. Reassured patient that her blood pressure is within the normal range. No red flag symptoms. Chest pain is reproducible and patient frequently feels MSK pain from moving heavy objects at work. Suggested further workup with EKG to rule out LVH, however patient declined stating she was not having active chest pain and wanted to go home and rest.  She was considered stable for discharge. Encouraged her to continue BP medication and follow up with her PCP routinely.  Final Clinical Impressions(s) / ED Diagnoses   Final diagnoses:  Blood pressure check  Hypertension, unspecified type    ED Discharge Orders    None       Howard Pouch, MD 12/16/17 2346    Blane Ohara, MD 12/17/17 [phone]

## 2018-11-22 ENCOUNTER — Observation Stay
Admission: EM | Admit: 2018-11-22 | Discharge: 2018-11-23 | Disposition: A | Discharge status: Home / Self Care | Payer: Medicaid Other | Attending: Internal Medicine | Admitting: Internal Medicine

## 2018-11-22 ENCOUNTER — Other Ambulatory Visit: Payer: Self-pay

## 2018-11-22 ENCOUNTER — Encounter: Payer: Self-pay | Admitting: Emergency Medicine

## 2018-11-22 DIAGNOSIS — R0989 Other specified symptoms and signs involving the circulatory and respiratory systems: Secondary | ICD-10-CM | POA: Insufficient documentation

## 2018-11-22 DIAGNOSIS — Z79899 Other long term (current) drug therapy: Secondary | ICD-10-CM | POA: Insufficient documentation

## 2018-11-22 DIAGNOSIS — T783XXA Angioneurotic edema, initial encounter: Secondary | ICD-10-CM | POA: Diagnosis present

## 2018-11-22 DIAGNOSIS — D841 Defects in the complement system: Principal | ICD-10-CM

## 2018-11-22 DIAGNOSIS — F209 Schizophrenia, unspecified: Secondary | ICD-10-CM | POA: Insufficient documentation

## 2018-11-22 DIAGNOSIS — R220 Localized swelling, mass and lump, head: Secondary | ICD-10-CM | POA: Insufficient documentation

## 2018-11-22 DIAGNOSIS — I10 Essential (primary) hypertension: Secondary | ICD-10-CM | POA: Insufficient documentation

## 2018-11-22 DIAGNOSIS — R7303 Prediabetes: Secondary | ICD-10-CM | POA: Insufficient documentation

## 2018-11-22 HISTORY — DX: Essential (primary) hypertension: I10

## 2018-11-22 MED ORDER — SODIUM CHLORIDE 0.9 % IV SOLN
INTRAVENOUS | Status: DC
  Administered 2018-11-23: 01:00:00 via INTRAVENOUS

## 2018-11-22 MED ORDER — DIPHENHYDRAMINE HCL 25 MG PO CAPS
ORAL_CAPSULE | ORAL | Status: AC
  Filled 2018-11-22: qty 2

## 2018-11-22 MED ORDER — C1 ESTERASE INHIBITOR (HUMAN) 500 UNITS IV KIT
2000.0000 [IU] | PACK | Freq: Once | INTRAVENOUS | Status: DC
  Filled 2018-11-22: qty 2000

## 2018-11-22 MED ORDER — DIPHENHYDRAMINE HCL 25 MG PO CAPS
50.0000 mg | ORAL_CAPSULE | Freq: Once | ORAL | Status: AC
  Administered 2018-11-22: 50 mg via ORAL

## 2018-11-22 MED ORDER — SODIUM CHLORIDE 0.9 % IV BOLUS
1000.0000 mL | Freq: Once | INTRAVENOUS | Status: AC
  Administered 2018-11-23: 1000 mL via INTRAVENOUS

## 2018-11-22 MED ORDER — PREDNISONE 20 MG PO TABS
60.0000 mg | ORAL_TABLET | Freq: Once | ORAL | Status: AC
  Administered 2018-11-22: 60 mg via ORAL
  Filled 2018-11-22: qty 3

## 2018-11-22 MED ORDER — DANAZOL 200 MG PO CAPS
200.0000 mg | ORAL_CAPSULE | Freq: Once | ORAL | Status: DC
  Filled 2018-11-22: qty 1

## 2018-11-22 MED ORDER — DANAZOL 100 MG PO CAPS
100.0000 mg | ORAL_CAPSULE | Freq: Once | ORAL | Status: DC
  Filled 2018-11-22: qty 1

## 2018-11-22 NOTE — ED Triage Notes (Signed)
Pt arrives POV to triage with c/o allergic reaction to unknown substance. Pt states that this started Thursday night after she drank some pineapple juice and is getting a little bit better now. Pt reports that she has taken no benadryl. Pt does have swelling that is noticeable to upper lip at this time. Pt is able to talk in clear sentences without difficulty at this time and is in NAD.

## 2018-11-22 NOTE — ED Provider Notes (Signed)
[facility] Emergency Department Provider Note  ____________________________________________  Time seen: Approximately 10:50 PM  I have reviewed the triage vital signs and the nursing notes.   HISTORY  Chief Complaint Allergic Reaction    HPI Kristen [name] is a 37 y.o. female who presents the emergency department complaining of edema, pain, vesiculation formation of upper and lower lips.  Patient reports that she had visible "blisters" to the upper and lower lips 2 days ago.  Patient reports that blisters seem to resolve somewhat without treatment.  Patient was at work last night, drank some pineapple juice, felt a burning sensation to her lips.  Patient reports that later in the shift she felt like her lips were swelling and looked in the mirror.  She had gross edema of the upper and lower lip.  Patient denies any difficulty breathing or swelling of the tongue.   Patient initially thought she had an allergic reaction but symptoms have not improved.  Patient presents the emergency department for evaluation at this time.  Patient denies any headache, neck pain or stiffness, chest pain, shortness of breath, abdominal pain, nausea or vomiting, diarrhea or constipation.  Patient does have a history of bipolar disorder but does not take lithium.  Patient with a history of hypertension but does not take ACE inhibitor's.  Patient denies any symptoms like this in the past.  She was not aware of any allergies to pineapple.  Patient reports that her mother had similar symptoms that occurred sporadically.   Past Medical History:  Diagnosis Date  . Borderline diabetes   . Hypertension   . Schizophrenia [facility])     Patient Active Problem List   Diagnosis Date Noted  . Angioedema 11/22/2018  . Bipolar 2 disorder (HCC) 05/09/2016  . Intermittent explosive disorder 04/27/2016  . Psychoses (HCC)   . Abscess of finger of left hand 05/19/2013    Past Surgical History:   Procedure Laterality Date  . CESAREAN SECTION      Prior to Admission medications   Medication Sig Start Date End Date Taking? Authorizing Provider  buPROPion (WELLBUTRIN XL) 150 MG 24 hr tablet Take 150 mg by mouth daily. 05/05/16 12/16/24  [provider]  phentermine 37.5 MG capsule Take 37.5 mg by mouth daily.    [provider]    Allergies Pineapple and Latex  Family History  Problem Relation Age of Onset  . Hypertension Other   . Diabetes Other   . CAD Other     Social History Social History   Tobacco Use  . Smoking status: Never Smoker  . Smokeless tobacco: Never Used  Substance Use Topics  . Alcohol use: Yes    Comment: occ  . Drug use: No     Review of Systems  Constitutional: No fever/chills Eyes: No visual changes. No discharge ENT: Positive for swelling of the upper and lower lip, sore throat Cardiovascular: no chest pain. Respiratory: no cough. No SOB. Gastrointestinal: No abdominal pain.  No nausea, no vomiting.  No diarrhea.  No constipation. Musculoskeletal: Negative for musculoskeletal pain. Skin: Negative for rash, abrasions, lacerations, ecchymosis. Neurological: Negative for headaches, focal weakness or numbness. 10-point ROS otherwise negative.  ____________________________________________   PHYSICAL EXAM:  VITAL SIGNS: ED Triage Vitals  Enc Vitals Group     BP 11/22/18 2211 (!) 164/86     Pulse Rate 11/22/18 2211 99     Resp 11/22/18 2211 18     Temp 11/22/18 2211 97.9 F (36.6 C)  Temp Source 11/22/18 2211 Oral     SpO2 11/22/18 2211 99 %     Weight 11/22/18 2209 250 lb (113.4 kg)     Height 11/22/18 2209 5\' 2"  (1.575 m)     Head Circumference --      Peak Flow --      Pain Score 11/22/18 2209 6     Pain Loc --      Pain Edu? --      Excl. in GC? --      Constitutional: Alert and oriented. Well appearing and in no acute distress. Eyes: Conjunctivae are normal. PERRL. EOMI. Head: Atraumatic. ENT:       Ears:       Nose: No congestion/rhinnorhea.      Mouth/Throat: Mucous membranes are moist.  Visualization of the upper and lower lip reveals gross edema.  Mild superficial fissuring is appreciated in the upper lip.  No vesicle formation.  No desquamation.  Visualization of the intraoral cavity reveals no significant edema of the tongue.  Patient has a Mallampati class IV.  On exam, patient has mild erythema of the posterior oropharynx.  Examination of the tonsils is unremarkable bilaterally.  Uvula is midline. Neck: No stridor.  Neck is supple full range of motion Hematological/Lymphatic/Immunilogical: No cervical lymphadenopathy. Cardiovascular: Normal rate, regular rhythm. Normal S1 and S2.  Good peripheral circulation. Respiratory: Normal respiratory effort without tachypnea or retractions. Lungs CTAB. Good air entry to the bases with no decreased or absent breath sounds. Gastrointestinal: Bowel sounds 4 quadrants. Soft and nontender to palpation. No guarding or rigidity. No palpable masses. No distention. No CVA tenderness. Musculoskeletal: Full range of motion to all extremities. No gross deformities appreciated. Neurologic:  Normal speech and language. No gross focal neurologic deficits are appreciated.  Skin:  Skin is warm, dry and intact. No rash noted. Psychiatric: Mood and affect are normal. Speech and behavior are normal. Patient exhibits appropriate insight and judgement.   ____________________________________________   LABS (all labs ordered are listed, but only abnormal results are displayed)  Labs Reviewed  COMPREHENSIVE METABOLIC PANEL  CBC WITH DIFFERENTIAL/PLATELET  C4 COMPLEMENT  C1 ESTERASE INHIBITOR   ____________________________________________  EKG   ____________________________________________  RADIOLOGY   No results found.  ____________________________________________    PROCEDURES  Procedure(s) performed:    .Critical Care Performed by:  Racheal Patches, PA-C Authorized by: Racheal Patches, PA-C   Critical care provider statement:    Critical care time (minutes):  45   Critical care time was exclusive of:  Separately billable procedures and treating other patients and teaching time   Critical care was necessary to treat or prevent imminent or life-threatening deterioration of the following conditions:  Respiratory failure   Critical care was time spent personally by me on the following activities:  Examination of patient, ordering and review of laboratory studies, re-evaluation of patient's condition, development of treatment plan with patient or surrogate, discussions with consultants and obtaining history from patient or surrogate      Medications  sodium chloride 0.9 % bolus 1,000 mL (has no administration in time range)  0.9 %  sodium chloride infusion (has no administration in time range)  C1 esterase inhibitor (Human) (BERINERT) injection 2,000 Units (has no administration in time range)  danazol (DANOCRINE) capsule 200 mg (has no administration in time range)  predniSONE (DELTASONE) tablet 60 mg (60 mg Oral Given 11/22/18 2233)  diphenhydrAMINE (BENADRYL) capsule 50 mg (50 mg Oral Given 11/22/18 2221)  ____________________________________________   INITIAL IMPRESSION / ASSESSMENT AND PLAN / ED COURSE  Pertinent labs & imaging results that were available during my care of the patient were reviewed by me and considered in my medical decision making (see chart for details).  Review of the [place] CSRS was performed in accordance of the [facility] prior to dispensing any controlled drugs.  Clinical Course as of Nov 23 2355  Fri Nov 22, 2018  2315 Patient presents emergency department complaining of upper and lower lip edema.  Patient reports that symptoms began 2 days ago.  Patient initially had no edema but had vesicular formation to the upper lip.  Patient reports that she went to work, was thirsty and  drank pineapple juice out of the refrigerator.  Patient reports that she does not routinely drink pineapple juice and had a feeling of swelling of her lips.  Patient looked in the mirror and noticed edema of both the upper and lower lip.  At this time, patient did not appreciate vesicle formation.  Patient reports that she does not take any medication for this as she expected edema to go down.  Patient reports that this has not improved and so she presents the emergency department concern for allergic reaction.  Patient has had no hives, wheezing, shortness of breath, GI upset.  On exam, patient has gross edema of the upper and lower lip.  Mild superficial fissuring, likely from edema.  Differential included staph scalded skin syndrome, Stevens-Johnson's, TENS, angioedema secondary to ACE inhibitor, hereditary angioedema.  Patient does not have other findings consistent with Stevens-Johnson's or staph scalded skin syndrome.  Patient is not taking any medications likely to produce Stevens-Johnson's or TENS.  Patient does not take an ACE inhibitor.  Patient's mother does have a history of intermittent angioedema.  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  As such, patient will be treated with C1 esterase inhibitor.  I discussed the case with attending provider, Dr. Lamont Snowball as well as hospitalist and we concur that patient is suitable for admission at this time.  Patient will be transferred from the minor care center the emergency department to major side of the emergency department for monitoring and administration of C1 esterase inhibitor.  At this time, patient is maintaining her own airway and secretions.  No indication for aggressive airway management at this time.     [JC]  2330 With patient diagnosis of hereditary angioedema, concern for airway is appreciated.  Patient has a Mallampati score 4.  At this time, no indication for aggressive airway management as she is maintaining  oxygen saturation and has normal respirations.  Patient is complaining of sore throat however with no indication of infectious origin.   [JC]  2341 I discussed the patient's case with pharmacist.  Patient has C1 esterase inhibitor ordered.  Talking with the pharmacist, they were 1 while short of complete dosing.  Given the patient has no significant respiratory is at this time, and likely dosing for ideal body weight versus actual body weight, patient will be given 2000 units versus 2500.  Pharmacy also recommends prescribing danazol which will be prescribed at 200 mg..  Patient has received prednisone and diphenhydramine prior to my assessment.  These medications have had no effect on patient's symptoms.   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  Patient presents emergency department with complaint of upper and lower lip  edema.  Patient reports that symptoms began 2 days ago with fasciculation formation.  Last night, patient drank pineapple juice and states that she had edema of the upper and lower lip.  This did not improve and patient presented to the emergency department for evaluation for possible allergic reaction.  On exam, this does not appear to be an allergic reaction.  No indication of anaphylaxis.  Patient did have some mild fissuring with gross edema of the upper and lower lip.  Patient was complaining of sore throat.  No indication of infectious origin to explain sore throat.  Mallampati score 4 however.  Differential includes staph scalded skin syndrome, TENS, Stevens-Johnson syndrome, anaphylaxis, angioedema, hereditary angioedema.  Patient does have a history of hypertension but does not take ACE inhibitor.  Patient does have bipolar disorder but does not take lithium or other medications likely to cause Stevens-Johnson's.  There is no vesicle formation or desquamation.  In addition, no other skin  findings concerning for staph scalded skin syndrome.  Given the patient's mother's history of intermittent angioedema this is likely hereditary angioedema.  As such, patient will be admitted and C1 esterase inhibitor will be initiated.  Given nature of diagnosis, patient will be transferred to the major side of the emergency department pending admission.  Patient is given ED precautions to return to the ED for any worsening or new symptoms.     ____________________________________________  FINAL CLINICAL IMPRESSION(S) / ED DIAGNOSES  Final diagnoses:  Hereditary angioedema (HCC)      NEW MEDICATIONS STARTED DURING THIS VISIT:  ED Discharge Orders    None          This chart was dictated using voice recognition software/Dragon. Despite best efforts to proofread, errors can occur which can change the meaning. Any change was purely unintentional.    Racheal PatchesCuthriell, Karlissa Aron D, PA-C 11/22/18 2356    Myrna BlazerSchaevitz, David Matthew, MD 11/24/18 [phone]

## 2018-11-23 LAB — CBC WITH DIFFERENTIAL/PLATELET
ABS IMMATURE GRANULOCYTES: 0.06 10*3/uL (ref 0.00–0.07)
BASOS ABS: 0 10*3/uL (ref 0.0–0.1)
Basophils Relative: 0 %
Eosinophils Absolute: 0.2 10*3/uL (ref 0.0–0.5)
Eosinophils Relative: 2 %
HCT: 34.6 % — ABNORMAL LOW (ref 36.0–46.0)
HEMOGLOBIN: 11.1 g/dL — AB (ref 12.0–15.0)
Immature Granulocytes: 1 %
LYMPHS PCT: 18 %
Lymphs Abs: 2.1 10*3/uL (ref 0.7–4.0)
MCH: 24.9 pg — ABNORMAL LOW (ref 26.0–34.0)
MCHC: 32.1 g/dL (ref 30.0–36.0)
MCV: 77.8 fL — ABNORMAL LOW (ref 80.0–100.0)
Monocytes Absolute: 0.6 10*3/uL (ref 0.1–1.0)
Monocytes Relative: 6 %
NRBC: 0 % (ref 0.0–0.2)
Neutro Abs: 8.2 10*3/uL — ABNORMAL HIGH (ref 1.7–7.7)
Neutrophils Relative %: 73 %
Platelets: 311 10*3/uL (ref 150–400)
RBC: 4.45 MIL/uL (ref 3.87–5.11)
RDW: 14.6 % (ref 11.5–15.5)
WBC: 11.2 10*3/uL — AB (ref 4.0–10.5)

## 2018-11-23 LAB — CBC
HCT: 37.3 % (ref 36.0–46.0)
Hemoglobin: 11.9 g/dL — ABNORMAL LOW (ref 12.0–15.0)
MCH: 24.5 pg — AB (ref 26.0–34.0)
MCHC: 31.9 g/dL (ref 30.0–36.0)
MCV: 76.9 fL — ABNORMAL LOW (ref 80.0–100.0)
Platelets: 377 10*3/uL (ref 150–400)
RBC: 4.85 MIL/uL (ref 3.87–5.11)
RDW: 14.6 % (ref 11.5–15.5)
WBC: 12.2 10*3/uL — ABNORMAL HIGH (ref 4.0–10.5)
nRBC: 0 % (ref 0.0–0.2)

## 2018-11-23 LAB — COMPREHENSIVE METABOLIC PANEL
ALT: 19 U/L (ref 0–44)
AST: 28 U/L (ref 15–41)
Albumin: 3.8 g/dL (ref 3.5–5.0)
Alkaline Phosphatase: 78 U/L (ref 38–126)
Anion gap: 7 (ref 5–15)
BUN: 15 mg/dL (ref 6–20)
CHLORIDE: 105 mmol/L (ref 98–111)
CO2: 23 mmol/L (ref 22–32)
Calcium: 8.7 mg/dL — ABNORMAL LOW (ref 8.9–10.3)
Creatinine, Ser: 0.77 mg/dL (ref 0.44–1.00)
GFR calc Af Amer: >60 mL/min (ref >60)
Glucose, Bld: 143 mg/dL — ABNORMAL HIGH (ref 70–99)
POTASSIUM: 4.3 mmol/L (ref 3.5–5.1)
Sodium: 135 mmol/L (ref 135–145)
Total Bilirubin: 0.5 mg/dL (ref 0.3–1.2)
Total Protein: 7.2 g/dL (ref 6.5–8.1)

## 2018-11-23 LAB — GLUCOSE, CAPILLARY
Glucose-Capillary: 255 mg/dL — ABNORMAL HIGH (ref 70–99)
Glucose-Capillary: 177 mg/dL — ABNORMAL HIGH (ref 70–99)

## 2018-11-23 LAB — BASIC METABOLIC PANEL
ANION GAP: 7 (ref 5–15)
BUN: 13 mg/dL (ref 6–20)
CO2: 21 mmol/L — AB (ref 22–32)
Calcium: 8.6 mg/dL — ABNORMAL LOW (ref 8.9–10.3)
Chloride: 107 mmol/L (ref 98–111)
Creatinine, Ser: 0.69 mg/dL (ref 0.44–1.00)
GFR calc Af Amer: >60 mL/min (ref >60)
GFR calc non Af Amer: >60 mL/min (ref >60)
GLUCOSE: 268 mg/dL — AB (ref 70–99)
Potassium: 3.9 mmol/L (ref 3.5–5.1)
Sodium: 135 mmol/L (ref 135–145)

## 2018-11-23 MED ORDER — ACETAMINOPHEN 325 MG PO TABS: 650.0000 mg | ORAL_TABLET | Freq: Four times a day (QID) | ORAL | Status: DC | PRN

## 2018-11-23 MED ORDER — ONDANSETRON HCL 4 MG/2ML IJ SOLN: 4.0000 mg | Freq: Four times a day (QID) | INTRAMUSCULAR | Status: DC | PRN

## 2018-11-23 MED ORDER — METHYLPREDNISOLONE SODIUM SUCC 40 MG IJ SOLR
40.0000 mg | Freq: Two times a day (BID) | INTRAMUSCULAR | Status: DC
  Administered 2018-11-23 (×2): 40 mg via INTRAVENOUS
  Filled 2018-11-23 (×2): qty 1

## 2018-11-23 MED ORDER — ONDANSETRON HCL 4 MG PO TABS: 4.0000 mg | ORAL_TABLET | Freq: Four times a day (QID) | ORAL | Status: DC | PRN

## 2018-11-23 MED ORDER — DIPHENHYDRAMINE HCL 50 MG/ML IJ SOLN
12.5000 mg | Freq: Three times a day (TID) | INTRAMUSCULAR | Status: DC
  Administered 2018-11-23 (×2): 12.5 mg via INTRAVENOUS
  Filled 2018-11-23 (×4): qty 0.25

## 2018-11-23 MED ORDER — INSULIN ASPART 100 UNIT/ML ~~LOC~~ SOLN
10.0000 [IU] | Freq: Once | SUBCUTANEOUS | Status: AC
  Administered 2018-11-23: 10 [IU] via SUBCUTANEOUS
  Filled 2018-11-23: qty 1

## 2018-11-23 MED ORDER — FAMOTIDINE IN NACL 20-0.9 MG/50ML-% IV SOLN
20.0000 mg | Freq: Once | INTRAVENOUS | Status: AC
  Administered 2018-11-23: 20 mg via INTRAVENOUS
  Filled 2018-11-23: qty 50

## 2018-11-23 MED ORDER — GUAIFENESIN-DM 100-10 MG/5ML PO SYRP
5.0000 mL | ORAL_SOLUTION | ORAL | Status: DC | PRN
  Administered 2018-11-23: 07:00:00 5 mL via ORAL
  Filled 2018-11-23 (×2): qty 5

## 2018-11-23 MED ORDER — FAMOTIDINE IN NACL 20-0.9 MG/50ML-% IV SOLN
20.0000 mg | Freq: Two times a day (BID) | INTRAVENOUS | Status: DC
  Administered 2018-11-23: 20 mg via INTRAVENOUS
  Filled 2018-11-23: qty 50

## 2018-11-23 MED ORDER — SODIUM CHLORIDE 0.9 % IV SOLN
INTRAVENOUS | Status: DC
  Administered 2018-11-23: 04:00:00 via INTRAVENOUS

## 2018-11-23 MED ORDER — SODIUM CHLORIDE 0.9% FLUSH
3.0000 mL | Freq: Two times a day (BID) | INTRAVENOUS | Status: DC
  Administered 2018-11-23 (×2): 3 mL via INTRAVENOUS

## 2018-11-23 MED ORDER — HYDROCHLOROTHIAZIDE 12.5 MG PO CAPS: 12.5000 mg | ORAL_CAPSULE | Freq: Every day | ORAL | 0 refills | Status: AC

## 2018-11-23 MED ORDER — BISACODYL 5 MG PO TBEC: 5.0000 mg | DELAYED_RELEASE_TABLET | Freq: Every day | ORAL | Status: DC | PRN

## 2018-11-23 MED ORDER — DOCUSATE SODIUM 100 MG PO CAPS
100.0000 mg | ORAL_CAPSULE | Freq: Two times a day (BID) | ORAL | Status: DC
  Administered 2018-11-23: 10:00:00 100 mg via ORAL
  Filled 2018-11-23: qty 1

## 2018-11-23 MED ORDER — HYDRALAZINE HCL 20 MG/ML IJ SOLN: 5.0000 mg | Freq: Four times a day (QID) | INTRAMUSCULAR | Status: DC | PRN

## 2018-11-23 MED ORDER — ACETAMINOPHEN 650 MG RE SUPP: 650.0000 mg | Freq: Four times a day (QID) | RECTAL | Status: DC | PRN

## 2018-11-23 NOTE — Progress Notes (Signed)
Patient transferred to room 122C from the ED via stretcher. Oriented to room and room equipment including call bell system Verified placement of Telemetry leads and monitor with NT- [place]. Patient is alert and oriented and currently complains of no pain, but some discomfort with swelling of lips but does not request anything for pain when asked. Patient ambulated to the restroom with standby assist. Will continue to monitor patient to end of shift.

## 2018-11-23 NOTE — ED Notes (Signed)
Pt in NSR. RA. A&Ox4.

## 2018-11-23 NOTE — Progress Notes (Signed)
Pt discharged via wheelchair by nursing to the ED entrance

## 2018-11-23 NOTE — Progress Notes (Signed)
[facility]         [place], [place].   11/23/2018  Patient: Kristen [name]   Date of Birth:  [birth year]  Date of admission:  11/22/2018  Date of Discharge  11/23/2018 evening   To Whom it May Concern:   Ermina Azzolina  may return to work on 11/24/18.  PHYSICAL ACTIVITY:  Full  If you have any questions or concerns, please don't hesitate to call.  Sincerely,   Altamese Dilling M.D Pager [phone] Office : [phone]   .

## 2018-11-23 NOTE — ED Notes (Signed)
Kristen [name] paged about bed assignment.

## 2018-11-23 NOTE — ED Notes (Signed)
Tiger top sent to lab 

## 2018-11-23 NOTE — Progress Notes (Signed)
Patient request cough syrup for she states her throat is scratchy cough and mild soreness. Offered chloraseptic spray but she declined and prefers the syrup. General admission Satnding PRN orders for Robitussin DM noted in chart and therefore ordered cough syrup.Explained to patient that will have to wait for pharmacy to send medication to the floor. Will continue to monitor ot end of shift.

## 2018-11-23 NOTE — ED Notes (Signed)
Verbal per Allena Katz that pt's room selection may be altered based on need & to hold on movement for now.

## 2018-11-23 NOTE — ED Notes (Signed)
Pt ambulatory to restroom no distress noted

## 2018-11-23 NOTE — ED Notes (Addendum)
Jonn Shinglesasheeda, RN called for pt arriving, pt was surly after this RN asked pt to please direct family member out of stretcher and ambulatory to toilet

## 2018-11-23 NOTE — Progress Notes (Signed)
MD order received in [facility] to discharge pt home today; verbally reviewed AVS with pt, Rx for hydrochlorothiazide escribed to [facility] in [place], [place], previous telephone call to [facility] [address] in [place], [place] to get Rx transferred to their location; able to transfer Rx to [place], [place]; pt made aware of Rx transfer; work note printed and given to pt previously typed by Dr Elisabeth Pigeon; no further questions voiced at this time; pt to be discharged via wheelchair by nursing to the ED entrance in order to get to her personal automobile

## 2018-11-23 NOTE — Plan of Care (Signed)

## 2018-11-23 NOTE — Discharge Summary (Signed)
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 Borderline diabetes   . Hypertension   . Schizophrenia (HCC)     HOSPITAL COURSE:   1.  Upper and lower lip swelling with throat irritation: Started after drinking pineapple juice.  Likely allergic reaction.  Less likely angioedema.  Patient started on steroid, Pepcid and Benadryl.  Monitor patient overnight.  Stable, no swelling now.   2.  History of hypertension: Not taking any medication at home.  PRN IV hydralazine.  Given prescription of antihypertensive at the time of discharge.  3.  Chronic other medical problem: Monitor.  DISCHARGE CONDITIONS:   Stable.  CONSULTS OBTAINED:    DRUG ALLERGIES:   Allergies  Allergen Reactions  . Pineapple Itching and Swelling  . Latex Hives, Itching, Swelling and Rash    DISCHARGE MEDICATIONS:   Allergies as of 11/23/2018      Reactions   Pineapple Itching, Swelling   Latex Hives, Itching, Swelling, Rash      Medication List    TAKE these medications   buPROPion 150 MG 24 hr tablet Commonly known as:  WELLBUTRIN XL Take 150 mg by mouth daily.   hydrochlorothiazide 12.5 MG capsule Commonly known as:  MICROZIDE Take 1 capsule (12.5 mg total) by mouth daily.   phentermine 37.5 MG capsule Take 37.5 mg by mouth daily.        DISCHARGE INSTRUCTIONS:    Follow with PMD in 1-2 weeks.  If you experience worsening of your admission symptoms, develop shortness of breath, life threatening emergency, suicidal or homicidal thoughts you must  seek medical attention immediately by calling 911 or calling your MD immediately  if symptoms less severe.  You Must read complete instructions/literature along with all the possible adverse reactions/side effects for all the Medicines you take and that have been prescribed to you. Take any new Medicines after you have completely understood and accept all the possible adverse reactions/side effects.   Please note  You were cared for by a hospitalist during your hospital stay. If you have any questions about your discharge medications or the care you received while you were in the hospital after you are discharged, you can call the unit and asked to speak with the hospitalist on call if the hospitalist that took care of you is not available. Once you are discharged, your primary care physician will handle any further medical issues. Please note that NO REFILLS for any discharge medications will be authorized once you are discharged, as it is imperative that you return to your primary care physician (or establish a relationship with a primary care physician if you do not have one) for your aftercare needs so that they can reassess your need for medications and monitor your lab values.    Today   CHIEF COMPLAINT:   Chief Complaint  Patient presents with  . Allergic Reaction    HISTORY OF PRESENT ILLNESS:  Kristen [name]  is a 37 y.o. female presented to emergency room for evaluation of lip swelling and scratchy throat.  Patient drank pineapple juice  yesterday and started having scratchy throat and noticed upper and lower lip swelling.  Patient denies tongue swelling or difficulty breathing.  Complaining of itching in upper and lower lip area.  Denies fever or chills.  Patient has history of hypertension but does not take blood pressure medication at home.  Currently she is not on any medication secondary to loss of insurance.  No other complaints.  In emergency room patient had diagnosis of  hereditary angioedema and requested admission.  Patient tells me that she does not know history of lip swelling or angioedema in the family.  Patient received steroid, Benadryl and Pepcid in emergency room.  Hospitalist team requested for admission.   VITAL SIGNS:  Blood pressure (!) 157/92, pulse 94, temperature 98.1 F (36.7 C), temperature source Oral, resp. rate (!) 22, height 5\' 2"  (1.575 m), weight 113.4 kg, last menstrual period 11/22/2018, SpO2 97 %.  I/O:    Intake/Output Summary (Last 24 hours) at 11/23/2018 1612 Last data filed at 11/23/2018 1600 Gross per 24 hour  Intake 1465.56 ml  Output -  Net 1465.56 ml    PHYSICAL EXAMINATION:  GENERAL:  37 y.o.-year-old patient lying in the bed with no acute distress.  EYES: Pupils equal, round, reactive to light and accommodation. No scleral icterus. Extraocular muscles intact.  HEENT: Head atraumatic, normocephalic. Oropharynx and nasopharynx clear.  NECK:  Supple, no jugular venous distention. No thyroid enlargement, no tenderness.  LUNGS: Normal breath sounds bilaterally, no wheezing, rales,rhonchi or crepitation. No use of accessory muscles of respiration.  CARDIOVASCULAR: S1, S2 normal. No murmurs, rubs, or gallops.  ABDOMEN: Soft, non-tender, non-distended. Bowel sounds present. No organomegaly or mass.  EXTREMITIES: No pedal edema, cyanosis, or clubbing.  NEUROLOGIC: Cranial nerves II through XII are intact. Muscle strength 5/5 in all extremities. Sensation intact. Gait not checked.  PSYCHIATRIC: The patient is alert and oriented x 3.  SKIN: No obvious rash, lesion, or ulcer.   DATA REVIEW:   CBC Recent Labs  Lab 11/23/18 0453  WBC 12.2*  HGB 11.9*  HCT 37.3  PLT 377    Chemistries  Recent Labs  Lab 11/23/18 0019 11/23/18 0453  NA 135 135  K 4.3 3.9  CL 105 107  CO2 23 21*  GLUCOSE 143* 268*  BUN 15 13  CREATININE 0.77 0.69  CALCIUM 8.7* 8.6*  AST 28  --   ALT 19  --   ALKPHOS 78  --   BILITOT 0.5   --     Cardiac Enzymes No results for input(s): TROPONINI in the last 168 hours.  Microbiology Results  No results found for this or any previous visit.  RADIOLOGY:  No results found.  EKG:   Orders placed or performed during the hospital encounter of 12/16/17  . ED EKG  . ED EKG      Management plans discussed with the patient, family and they are in agreement.  CODE STATUS: Full.    Code Status Orders  (From admission, onward)         Start     Ordered   11/23/18 0255  Full code  Continuous     11/23/18 0254        Code Status History    Date Active Date Inactive Code Status Order ID Comments User Context   05/09/2016 0303 05/09/2016 1334 Full Code [id number]  Elpidio Anis, PA-C ED      TOTAL TIME TAKING CARE OF THIS PATIENT: 35 minutes.    Altamese Dilling M.D  on 11/23/2018 at 4:12 PM  Between 7am to 6pm - Pager - [phone]  After 6pm go to [email or web address] - password [email or web address]  [facility]  Office  [phone]  CC: Primary care physician; [facility]   Note: This dictation was prepared with Dragon dictation along with smaller phrase technology. Any transcriptional errors that result from this process are unintentional.

## 2018-11-23 NOTE — ED Notes (Signed)
Pt keeps bending arm. Educated this will stop or slow fluids that are running and to keep arm straight. Towel placed under arm.

## 2018-11-23 NOTE — Progress Notes (Signed)
Notified hospitalist of CBG result-255. Orders given to administer 10 units of Novolog. Administered 10 units of Novolog per doctor's order. Patient complains of no pain or discomfort at this time. Patient ambulated to the bathroom with standby assist. Will report off to oncoming nurse.

## 2018-11-23 NOTE — ED Notes (Signed)
Pt snoring. Has sister sleeping in bed next to her. NS bolus not yet completed as pt keeps bending arm.

## 2018-11-23 NOTE — H&P (Signed)
[facility] - [place] at [facility]   PATIENT NAME: Kristen CarawayJozelle [name]    MR#:  [id number]  DATE OF BIRTH:  26-Feb-1981  DATE OF ADMISSION:  11/22/2018  PRIMARY CARE PHYSICIAN: [facility]   REQUESTING/REFERRING PHYSICIAN: Emergency room  CHIEF COMPLAINT:   Chief Complaint  Patient presents with  . Allergic Reaction    HISTORY OF PRESENT ILLNESS:  Kristen [name]  is a 37 y.o. female with a known history listed below presented to emergency room for evaluation of lip swelling and scratchy throat.  Patient drank pineapple juice yesterday and started having scratchy throat and noticed upper and lower lip swelling.  Patient denies tongue swelling or difficulty breathing.  Complaining of itching in upper and lower lip area.  Denies fever or chills.  Patient has history of hypertension but does not take blood pressure medication at home.  Currently she is not on any medication secondary to loss of insurance.  No other complaints.  In emergency room patient had diagnosis of hereditary angioedema and requested admission.  Patient tells me that she does not know history of lip swelling or angioedema in the family.  Patient received steroid, Benadryl and Pepcid in emergency room.  Hospitalist team requested for admission.  PAST MEDICAL HISTORY:   Past Medical History:  Diagnosis Date  . Borderline diabetes   . Hypertension   . Schizophrenia (HCC)     PAST SURGICAL HISTORY:   Past Surgical History:  Procedure Laterality Date  . CESAREAN SECTION      SOCIAL HISTORY:   Social History   Tobacco Use  . Smoking status: Never Smoker  . Smokeless tobacco: Never Used  Substance Use Topics  . Alcohol use: Yes    Comment: occ    FAMILY HISTORY:   Family History  Problem Relation Age of Onset  . Hypertension Other   . Diabetes Other   . CAD Other     DRUG ALLERGIES:   Allergies  Allergen Reactions  . Pineapple Itching and Swelling  . Latex Hives,  Itching, Swelling and Rash    REVIEW OF SYSTEMS:   ROS -12 point review of system reviewed positive as per HPI otherwise negative.  MEDICATIONS AT HOME:   Prior to Admission medications   Medication Sig Start Date End Date Taking? Authorizing Provider  buPROPion (WELLBUTRIN XL) 150 MG 24 hr tablet Take 150 mg by mouth daily. 05/05/16 12/16/24  [provider]  phentermine 37.5 MG capsule Take 37.5 mg by mouth daily.    [provider]      VITAL SIGNS:  Blood pressure (!) 162/82, pulse 98, temperature 97.9 F (36.6 C), temperature source Oral, resp. rate (!) 27, height 5\' 2"  (1.575 m), weight 113.4 kg, last menstrual period 11/22/2018, SpO2 98 %.  PHYSICAL EXAMINATION:  Physical Exam  GENERAL:  37 y.o.-year-old patient lying in the bed with no acute distress.  EYES: Pupils equal, round, reactive to light and accommodation. No scleral icterus. Extraocular muscles intact.  HEENT: Head atraumatic, normocephalic.  Upper and lower lip swelling.  No tongue or throat swelling.  No redness noted inside the mouth. NECK:  Supple, no jugular venous distention. No thyroid enlargement, no tenderness.  LUNGS: Normal breath sounds bilaterally, no wheezing, rales,rhonchi or crepitation. No use of accessory muscles of respiration.  CARDIOVASCULAR: S1, S2 normal. No murmurs, rubs, or gallops.  ABDOMEN: Soft, nontender, nondistended. Bowel sounds present. No organomegaly or mass.  EXTREMITIES: No pedal edema, cyanosis, or clubbing.  NEUROLOGIC:  Cranial nerves II through XII are intact. Muscle strength 5/5 in all extremities. Sensation intact. Gait not checked.  PSYCHIATRIC: The patient is alert and oriented x 3.  SKIN: No obvious rash, lesion, or ulcer.   LABORATORY PANEL:   CBC Recent Labs  Lab 11/23/18 0019  WBC 11.2*  HGB 11.1*  HCT 34.6*  PLT 311    ------------------------------------------------------------------------------------------------------------------  Chemistries  Recent Labs  Lab 11/23/18 0019  NA 135  K 4.3  CL 105  CO2 23  GLUCOSE 143*  BUN 15  CREATININE 0.77  CALCIUM 8.7*  AST 28  ALT 19  ALKPHOS 78  BILITOT 0.5   ------------------------------------------------------------------------------------------------------------------  Cardiac Enzymes No results for input(s): TROPONINI in the last 168 hours. ------------------------------------------------------------------------------------------------------------------  RADIOLOGY:  No results found.    IMPRESSION AND PLAN:   1.  Upper and lower lip swelling with throat irritation: Started after drinking pineapple juice.  Likely allergic reaction.  Less likely angioedema.  Patient started on steroid, Pepcid and Benadryl.  Monitor patient overnight.  Will hold on C1 esterase inhibitor ordered in the emergency room.  Will give if patient symptoms are worsening.   2.  History of hypertension: Not taking any medication at home.  PRN IV hydralazine.  Patient will need prescription of antihypertensive at the time of discharge.  3.  Chronic other medical problem: Monitor  DVT prophylaxis: SCD  Estimated length of stay less than 2 midnight  Moderate risk secondary to above   All the records are reviewed and case discussed with ED provider. Management plans discussed with the patient, family and they are in agreement.  CODE STATUS: Full  TOTAL TIME TAKING CARE OF THIS PATIENT: 30 minutes.    Montez Morita M.D on 11/23/2018 at 2:17 AM  Between 7am to 6pm - Pager - [phone]  After 6pm go to [email or web address] - [facility]  Office  [phone]  CC: Primary care physician; [facility]

## 2018-11-23 NOTE — ED Notes (Signed)
Verbal order per hospitalist to give pepcid IV.

## 2018-11-23 NOTE — ED Notes (Signed)
Grn, Red, Lav tops sent b4 vessel blew.

## 2018-11-23 NOTE — ED Notes (Addendum)
Per hospitalist hold Berinert but keep handy. If s/s inc then hang gtt per verbal order. Hold danocrine verbal order.

## 2018-11-24 LAB — C4 COMPLEMENT: Complement C4, Body Fluid: 40 mg/dL (ref 14–44)

## 2018-11-25 LAB — C1 ESTERASE INHIBITOR: C1 ESTERASE INH: 34 mg/dL (ref 21–39)

## 2020-01-19 ENCOUNTER — Other Ambulatory Visit: Payer: Self-pay

## 2020-01-19 ENCOUNTER — Emergency Department
Admission: EM | Admit: 2020-01-19 | Discharge: 2020-01-19 | Disposition: A | Discharge status: Home / Self Care | Payer: Medicaid Other | Attending: Student | Admitting: Student

## 2020-01-19 DIAGNOSIS — I10 Essential (primary) hypertension: Secondary | ICD-10-CM | POA: Insufficient documentation

## 2020-01-19 DIAGNOSIS — L03012 Cellulitis of left finger: Secondary | ICD-10-CM

## 2020-01-19 DIAGNOSIS — Z91040 Latex allergy status: Secondary | ICD-10-CM | POA: Insufficient documentation

## 2020-01-19 DIAGNOSIS — Z79899 Other long term (current) drug therapy: Secondary | ICD-10-CM | POA: Insufficient documentation

## 2020-01-19 MED ORDER — TRAMADOL HCL 50 MG PO TABS: 50.0000 mg | ORAL_TABLET | Freq: Four times a day (QID) | ORAL | 0 refills | Status: AC | PRN

## 2020-01-19 MED ORDER — SULFAMETHOXAZOLE-TRIMETHOPRIM 800-160 MG PO TABS: 1.0000 | ORAL_TABLET | Freq: Two times a day (BID) | ORAL | 0 refills | Status: DC

## 2020-01-19 MED ORDER — LIDOCAINE-EPINEPHRINE-TETRACAINE (LET) TOPICAL GEL
3.0000 mL | Freq: Once | TOPICAL | Status: AC
  Administered 2020-01-19: 3 mL via TOPICAL
  Filled 2020-01-19: qty 3

## 2020-01-19 NOTE — ED Provider Notes (Signed)
[facility] Emergency Department Provider Note   ____________________________________________   First MD Initiated Contact with Patient 01/19/20 [phone]     (approximate)  I have reviewed the triage vital signs and the nursing notes.   HISTORY  Chief Complaint Hand Pain    HPI Kristen [name] is a 39 y.o. female patient presents with pain and swelling to the fourth digit left hand for couple days.  Patient denies loss sensation or loss of function.  Patient rates pain 7/10.  Prescribed pain is "achy".  No palliative measure for complaint.  It was noted at triage patient blood pressure elevated.  Patient has not taken her medicine today.         Past Medical History:  Diagnosis Date  . Borderline diabetes   . Hypertension   . Schizophrenia [facility])     Patient Active Problem List   Diagnosis Date Noted  . Angioedema 11/22/2018  . Bipolar 2 disorder (HCC) 05/09/2016  . Intermittent explosive disorder 04/27/2016  . Psychoses (HCC)   . Abscess of finger of left hand 05/19/2013    Past Surgical History:  Procedure Laterality Date  . CESAREAN SECTION      Prior to Admission medications   Medication Sig Start Date End Date Taking? Authorizing Provider  buPROPion (WELLBUTRIN XL) 150 MG 24 hr tablet Take 150 mg by mouth daily. 05/05/16 12/16/24  [provider]  hydrochlorothiazide (MICROZIDE) 12.5 MG capsule Take 1 capsule (12.5 mg total) by mouth daily. 11/23/18   Altamese Dilling, MD  phentermine 37.5 MG capsule Take 37.5 mg by mouth daily.    [provider]  sulfamethoxazole-trimethoprim (BACTRIM DS) 800-160 MG tablet Take 1 tablet by mouth 2 (two) times daily. 01/19/20   Joni Reining, PA-C  traMADol (ULTRAM) 50 MG tablet Take 1 tablet (50 mg total) by mouth every 6 (six) hours as needed. 01/19/20 01/18/21  Joni Reining, PA-C    Allergies Pineapple and Latex  Family History  Problem Relation Age of Onset  .  Hypertension Other   . Diabetes Other   . CAD Other     Social History Social History   Tobacco Use  . Smoking status: Never Smoker  . Smokeless tobacco: Never Used  Substance Use Topics  . Alcohol use: Yes    Comment: occ  . Drug use: No    Review of Systems  Constitutional: No fever/chills Eyes: No visual changes. ENT: No sore throat. Cardiovascular: Denies chest pain. Respiratory: Denies shortness of breath. Gastrointestinal: No abdominal pain.  No nausea, no vomiting.  No diarrhea.  No constipation. Genitourinary: Negative for dysuria. Musculoskeletal: Negative for back pain. Skin: Negative for rash. Neurological: Negative for headaches, focal weakness or numbness. Endocrine:  Hypertension and prediabetic. Allergic/Immunilogical: Pineapple and latex. ____________________________________________   PHYSICAL EXAM:  VITAL SIGNS: ED Triage Vitals  Enc Vitals Group     BP 01/19/20 0831 (!) 172/102     Pulse Rate 01/19/20 0831 97     Resp 01/19/20 0831 20     Temp 01/19/20 0831 98 F (36.7 C)     Temp Source 01/19/20 0831 Oral     SpO2 01/19/20 0831 100 %     Weight 01/19/20 0833 260 lb (117.9 kg)     Height 01/19/20 0833 5\' 2"  (1.575 m)     Head Circumference --      Peak Flow --      Pain Score 01/19/20 0832 7     Pain Loc --  Pain Edu? --      Excl. in [place]? --     Constitutional: Alert and oriented. Well appearing and in no acute distress. Cardiovascular: Normal rate, regular rhythm. Grossly normal heart sounds.  Good peripheral circulation.  Elevated blood pressure. Respiratory: Normal respiratory effort.  No retractions. Lungs CTAB. Musculoskeletal: No lower extremity tenderness nor edema.  No joint effusions. Neurologic:  Normal speech and language. No gross focal neurologic deficits are appreciated. No gait instability. Skin:  Skin is warm, dry and intact. No rash noted. Psychiatric: Mood and affect are normal. Speech and behavior are  normal.  ____________________________________________   LABS (all labs ordered are listed, but only abnormal results are displayed)  Labs Reviewed - No data to display ____________________________________________  EKG   ____________________________________________  RADIOLOGY  ED MD interpretation:    Official radiology report(s): No results found.  ____________________________________________   PROCEDURES  Procedure(s) performed (including Critical Care):  Marland KitchenMarland KitchenIncision and Drainage  Date/Time: 01/19/2020 9:26 AM Performed by: Sable Feil, PA-C Authorized by: Sable Feil, PA-C   Consent:    Consent obtained:  Verbal   Consent given by:  Patient   Risks discussed:  Bleeding, incomplete drainage and pain Location:    Type:  Abscess   Location:  Upper extremity   Upper extremity location:  Finger   Finger location:  L ring finger Pre-procedure details:    Skin preparation:  Betadine Anesthesia (see MAR for exact dosages):    Anesthesia method:  Topical application   Topical anesthetic:  LET Procedure type:    Complexity:  Simple Procedure details:    Incision types:  Stab incision   Incision depth:  Subcutaneous   Scalpel blade:  11   Drainage:  Purulent   Drainage amount:  Scant   Wound treatment:  Wound left open   Packing materials:  None Post-procedure details:    Patient tolerance of procedure:  Tolerated well, no immediate complications     ____________________________________________   INITIAL IMPRESSION / ASSESSMENT AND PLAN / ED COURSE  As part of my medical decision making, I reviewed the following data within the [place]     Patient presents with pain and edema to the left fourth finger.  Physical exam is consistent with paronychia of the finger.  See procedure note for I&D.  Patient given discharge care instruction advised take medication as directed.    Kristen [name] was evaluated in Emergency Department on  01/19/2020 for the symptoms described in the history of present illness. She was evaluated in the context of the global COVID-19 pandemic, which necessitated consideration that the patient might be at risk for infection with the SARS-CoV-2 virus that causes COVID-19. Institutional protocols and algorithms that pertain to the evaluation of patients at risk for COVID-19 are in a state of rapid change based on information released by regulatory bodies including the CDC and federal and state organizations. These policies and algorithms were followed during the patient's care in the ED.       ____________________________________________   FINAL CLINICAL IMPRESSION(S) / ED DIAGNOSES  Final diagnoses:  Paronychia of finger of left hand     ED Discharge Orders         Ordered    traMADol (ULTRAM) 50 MG tablet  Every 6 hours PRN     01/19/20 0858    sulfamethoxazole-trimethoprim (BACTRIM DS) 800-160 MG tablet  2 times daily     01/19/20 0858  Note:  This document was prepared using Dragon voice recognition software and may include unintentional dictation errors.    Joni Reining, PA-C 01/19/20 2500    Miguel Aschoff., MD 01/19/20 1154

## 2020-01-19 NOTE — Discharge Instructions (Signed)
Follow discharge care instruction and take medication as directed.  Advised Epson salt soak twice a day for 2 to 3 days.

## 2020-01-19 NOTE — ED Triage Notes (Signed)
Pt c/o pain and swelling of the left 4th finger for the past couple of days.

## 2020-01-19 NOTE — ED Notes (Signed)
See triage note  Presents with pain and swelling to left 4 th finger   States she picked at an ingrown nail about 4 days ago   Now area is swollen and tender

## 2020-03-25 ENCOUNTER — Other Ambulatory Visit: Payer: Self-pay

## 2020-03-25 ENCOUNTER — Encounter (HOSPITAL_BASED_OUTPATIENT_CLINIC_OR_DEPARTMENT_OTHER): Payer: Self-pay | Admitting: *Deleted

## 2020-03-25 ENCOUNTER — Emergency Department (HOSPITAL_BASED_OUTPATIENT_CLINIC_OR_DEPARTMENT_OTHER)
Admission: EM | Admit: 2020-03-25 | Discharge: 2020-03-25 | Disposition: A | Discharge status: Home / Self Care | Payer: 59 | Attending: Emergency Medicine | Admitting: Emergency Medicine

## 2020-03-25 DIAGNOSIS — I10 Essential (primary) hypertension: Secondary | ICD-10-CM | POA: Insufficient documentation

## 2020-03-25 DIAGNOSIS — F419 Anxiety disorder, unspecified: Secondary | ICD-10-CM | POA: Insufficient documentation

## 2020-03-25 DIAGNOSIS — R002 Palpitations: Secondary | ICD-10-CM | POA: Diagnosis not present

## 2020-03-25 DIAGNOSIS — Z79899 Other long term (current) drug therapy: Secondary | ICD-10-CM | POA: Insufficient documentation

## 2020-03-25 DIAGNOSIS — G4700 Insomnia, unspecified: Secondary | ICD-10-CM | POA: Diagnosis not present

## 2020-03-25 LAB — CBC WITH DIFFERENTIAL/PLATELET
Abs Immature Granulocytes: 0.02 10*3/uL (ref 0.00–0.07)
Basophils Absolute: 0 10*3/uL (ref 0.0–0.1)
Basophils Relative: 0 %
Eosinophils Absolute: 0.1 10*3/uL (ref 0.0–0.5)
Eosinophils Relative: 1 %
HCT: 39.9 % (ref 36.0–46.0)
Hemoglobin: 13 g/dL (ref 12.0–15.0)
Immature Granulocytes: 0 %
Lymphocytes Relative: 22 %
Lymphs Abs: 2.1 10*3/uL (ref 0.7–4.0)
MCH: 25.8 pg — ABNORMAL LOW (ref 26.0–34.0)
MCHC: 32.6 g/dL (ref 30.0–36.0)
MCV: 79.2 fL — ABNORMAL LOW (ref 80.0–100.0)
Monocytes Absolute: 0.6 10*3/uL (ref 0.1–1.0)
Monocytes Relative: 6 %
Neutro Abs: 6.9 10*3/uL (ref 1.7–7.7)
Neutrophils Relative %: 71 %
Platelets: 399 10*3/uL (ref 150–400)
RBC: 5.04 MIL/uL (ref 3.87–5.11)
RDW: 14.1 % (ref 11.5–15.5)
WBC: 9.7 10*3/uL (ref 4.0–10.5)
nRBC: 0 % (ref 0.0–0.2)

## 2020-03-25 LAB — COMPREHENSIVE METABOLIC PANEL
ALT: 16 U/L (ref 0–44)
AST: 21 U/L (ref 15–41)
Albumin: 4.3 g/dL (ref 3.5–5.0)
Alkaline Phosphatase: 66 U/L (ref 38–126)
Anion gap: 12 (ref 5–15)
BUN: 10 mg/dL (ref 6–20)
CO2: 24 mmol/L (ref 22–32)
Calcium: 9.9 mg/dL (ref 8.9–10.3)
Chloride: 99 mmol/L (ref 98–111)
Creatinine, Ser: 0.9 mg/dL (ref 0.44–1.00)
GFR calc Af Amer: >60 mL/min (ref >60)
GFR calc non Af Amer: >60 mL/min (ref >60)
Glucose, Bld: 163 mg/dL — ABNORMAL HIGH (ref 70–99)
Potassium: 3.3 mmol/L — ABNORMAL LOW (ref 3.5–5.1)
Sodium: 135 mmol/L (ref 135–145)
Total Bilirubin: 0.7 mg/dL (ref 0.3–1.2)
Total Protein: 8.3 g/dL — ABNORMAL HIGH (ref 6.5–8.1)

## 2020-03-25 MED ORDER — DIAZEPAM 2 MG PO TABS: 2.0000 mg | ORAL_TABLET | Freq: Once | ORAL | Status: DC

## 2020-03-25 MED ORDER — MELATONIN 5 MG PO CAPS: 1.0000 | ORAL_CAPSULE | Freq: Every evening | ORAL | 0 refills | Status: AC

## 2020-03-25 MED ORDER — SODIUM CHLORIDE 0.9 % IV SOLN: Freq: Once | INTRAVENOUS | Status: AC

## 2020-03-25 MED ORDER — HYDROXYZINE HCL 25 MG PO TABS
50.0000 mg | ORAL_TABLET | Freq: Once | ORAL | Status: AC
  Administered 2020-03-25: 50 mg via ORAL
  Filled 2020-03-25: qty 2

## 2020-03-25 MED ORDER — POTASSIUM CHLORIDE CRYS ER 20 MEQ PO TBCR
40.0000 meq | EXTENDED_RELEASE_TABLET | Freq: Once | ORAL | Status: AC
  Administered 2020-03-25: 16:00:00 40 meq via ORAL
  Filled 2020-03-25: qty 2

## 2020-03-25 NOTE — ED Triage Notes (Signed)
Pt c/o " anxiety, anxious x 1 week " called EMS to house  For increased HR
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
## 2020-03-25 NOTE — ED Notes (Addendum)
Pt up to BR  Ambulatory and given ginger ale with meds

## 2020-03-25 NOTE — ED Notes (Signed)
Pt verbalized understanding of dc instructions.

## 2020-03-25 NOTE — Discharge Instructions (Addendum)
Please use melatonin at home 5-10 mg nightly for sleep.  Please try to obtain 8 hours of sleep a night.

## 2020-03-28 ENCOUNTER — Emergency Department (HOSPITAL_BASED_OUTPATIENT_CLINIC_OR_DEPARTMENT_OTHER)
Admission: EM | Admit: 2020-03-28 | Discharge: 2020-03-29 | Disposition: A | Discharge status: Other Acute Inpatient Hospital | Payer: 59 | Attending: Emergency Medicine | Admitting: Emergency Medicine

## 2020-03-28 ENCOUNTER — Encounter (HOSPITAL_BASED_OUTPATIENT_CLINIC_OR_DEPARTMENT_OTHER): Payer: Self-pay

## 2020-03-28 ENCOUNTER — Other Ambulatory Visit: Payer: Self-pay

## 2020-03-28 DIAGNOSIS — F250 Schizoaffective disorder, bipolar type: Secondary | ICD-10-CM | POA: Insufficient documentation

## 2020-03-28 DIAGNOSIS — F309 Manic episode, unspecified: Secondary | ICD-10-CM

## 2020-03-28 DIAGNOSIS — I10 Essential (primary) hypertension: Secondary | ICD-10-CM | POA: Insufficient documentation

## 2020-03-28 DIAGNOSIS — Z91040 Latex allergy status: Secondary | ICD-10-CM | POA: Insufficient documentation

## 2020-03-28 DIAGNOSIS — Z7984 Long term (current) use of oral hypoglycemic drugs: Secondary | ICD-10-CM | POA: Diagnosis not present

## 2020-03-28 DIAGNOSIS — Z20822 Contact with and (suspected) exposure to covid-19: Secondary | ICD-10-CM | POA: Diagnosis not present

## 2020-03-28 DIAGNOSIS — E119 Type 2 diabetes mellitus without complications: Secondary | ICD-10-CM | POA: Insufficient documentation

## 2020-03-28 DIAGNOSIS — Z79899 Other long term (current) drug therapy: Secondary | ICD-10-CM | POA: Insufficient documentation

## 2020-03-28 HISTORY — DX: Type 2 diabetes mellitus without complications: E11.9

## 2020-03-28 HISTORY — DX: Bipolar disorder, unspecified: F31.9

## 2020-03-28 LAB — RESPIRATORY PANEL BY RT PCR (FLU A&B, COVID)
Influenza A by PCR: NEGATIVE
Influenza B by PCR: NEGATIVE
SARS Coronavirus 2 by RT PCR: NEGATIVE

## 2020-03-28 LAB — CBC WITH DIFFERENTIAL/PLATELET
Abs Immature Granulocytes: 0.04 10*3/uL (ref 0.00–0.07)
Basophils Absolute: 0.1 10*3/uL (ref 0.0–0.1)
Basophils Relative: 1 %
Eosinophils Absolute: 0.2 10*3/uL (ref 0.0–0.5)
Eosinophils Relative: 1 %
HCT: 37.7 % (ref 36.0–46.0)
Hemoglobin: 12.6 g/dL (ref 12.0–15.0)
Immature Granulocytes: 0 %
Lymphocytes Relative: 29 %
Lymphs Abs: 3 10*3/uL (ref 0.7–4.0)
MCH: 26.1 pg (ref 26.0–34.0)
MCHC: 33.4 g/dL (ref 30.0–36.0)
MCV: 78.2 fL — ABNORMAL LOW (ref 80.0–100.0)
Monocytes Absolute: 0.5 10*3/uL (ref 0.1–1.0)
Monocytes Relative: 5 %
Neutro Abs: 6.6 10*3/uL (ref 1.7–7.7)
Neutrophils Relative %: 64 %
Platelets: 352 10*3/uL (ref 150–400)
RBC: 4.82 MIL/uL (ref 3.87–5.11)
RDW: 14.1 % (ref 11.5–15.5)
WBC: 10.4 10*3/uL (ref 4.0–10.5)
nRBC: 0 % (ref 0.0–0.2)

## 2020-03-28 LAB — COMPREHENSIVE METABOLIC PANEL
ALT: 19 U/L (ref 0–44)
AST: 25 U/L (ref 15–41)
Albumin: 4.1 g/dL (ref 3.5–5.0)
Alkaline Phosphatase: 73 U/L (ref 38–126)
Anion gap: 9 (ref 5–15)
BUN: 10 mg/dL (ref 6–20)
CO2: 22 mmol/L (ref 22–32)
Calcium: 9.2 mg/dL (ref 8.9–10.3)
Chloride: 103 mmol/L (ref 98–111)
Creatinine, Ser: 0.87 mg/dL (ref 0.44–1.00)
GFR calc Af Amer: >60 mL/min (ref >60)
GFR calc non Af Amer: >60 mL/min (ref >60)
Glucose, Bld: 161 mg/dL — ABNORMAL HIGH (ref 70–99)
Potassium: 3.9 mmol/L (ref 3.5–5.1)
Sodium: 134 mmol/L — ABNORMAL LOW (ref 135–145)
Total Bilirubin: 0.4 mg/dL (ref 0.3–1.2)
Total Protein: 7.9 g/dL (ref 6.5–8.1)

## 2020-03-28 LAB — RAPID URINE DRUG SCREEN, HOSP PERFORMED
Amphetamines: NOT DETECTED
Barbiturates: NOT DETECTED
Benzodiazepines: NOT DETECTED
Cocaine: NOT DETECTED
Opiates: NOT DETECTED
Tetrahydrocannabinol: POSITIVE — AB

## 2020-03-28 LAB — PREGNANCY, URINE: Preg Test, Ur: NEGATIVE

## 2020-03-28 LAB — ETHANOL: Alcohol, Ethyl (B): <10 mg/dL (ref <10)

## 2020-03-28 MED ORDER — ZIPRASIDONE MESYLATE 20 MG IM SOLR
INTRAMUSCULAR | Status: AC
  Filled 2020-03-28: qty 20

## 2020-03-28 MED ORDER — METFORMIN HCL 500 MG PO TABS: 1000.0000 mg | ORAL_TABLET | Freq: Two times a day (BID) | ORAL | Status: DC

## 2020-03-28 MED ORDER — LISINOPRIL 10 MG PO TABS
20.0000 mg | ORAL_TABLET | Freq: Every day | ORAL | Status: DC
  Administered 2020-03-28: 20 mg via ORAL
  Filled 2020-03-28: qty 2

## 2020-03-28 MED ORDER — ACETAMINOPHEN 325 MG PO TABS: 650.0000 mg | ORAL_TABLET | ORAL | Status: DC | PRN

## 2020-03-28 MED ORDER — ZOLPIDEM TARTRATE 5 MG PO TABS
5.0000 mg | ORAL_TABLET | Freq: Every evening | ORAL | Status: DC | PRN
  Filled 2020-03-28: qty 1

## 2020-03-28 MED ORDER — METFORMIN HCL 500 MG PO TABS
1000.0000 mg | ORAL_TABLET | Freq: Every day | ORAL | Status: DC
  Administered 2020-03-29: 1000 mg via ORAL
  Filled 2020-03-28: qty 2

## 2020-03-28 MED ORDER — HALOPERIDOL LACTATE 5 MG/ML IJ SOLN
5.0000 mg | Freq: Once | INTRAMUSCULAR | Status: AC
  Administered 2020-03-28: 5 mg via INTRAMUSCULAR
  Filled 2020-03-28: qty 1

## 2020-03-28 MED ORDER — BUPROPION HCL ER (XL) 150 MG PO TB24
150.0000 mg | ORAL_TABLET | Freq: Every day | ORAL | Status: DC
  Filled 2020-03-28: qty 1

## 2020-03-28 NOTE — ED Notes (Signed)
Pt now up in the room and anxious stating she wants to leave. She states she just wants a Rx for a sleep aid and go home. Contacted TTS and they advised pt needs to be IVC'd based on her assessment. EDP was notified and provided with IVC paperwork to file.

## 2020-03-28 NOTE — ED Provider Notes (Signed)
[facility] EMERGENCY DEPARTMENT Provider Note   CSN: [id number] Arrival date & time: 03/28/20  1311     History Chief Complaint  Patient presents with  . Manic Behavior    Kristen [name] is a 39 y.o. female.  HPI     39yo female with history of bipolar, schizophrenia, DM, htn, presents with concern for anxiety, not sleeping.   Not sleeping for about 4-5 days, severe anxiety. Trying medications but not helping.  Seeing things, hearing voices.   Reports "I think I was poisoned, my urine is yellow, I'm coughing up stuff" States she is glad she is hearing voices as they get her closer to the spiritual realm.  Worried her facebook has been hacked.  Reports her anxiety is through the roof. She was recently seen in the ED and she reports she declined psychiatric admission at that time, but this time she is ready to be admitted and made sure her 15yo daughter was taken care of before she came in.    Past Medical History:  Diagnosis Date  . Bipolar 1 disorder (HCC)   . Borderline diabetes   . Diabetes mellitus without complication (HCC)   . Hypertension   . Schizophrenia [facility])     Patient Active Problem List   Diagnosis Date Noted  . Angioedema 11/22/2018  . Bipolar 2 disorder (HCC) 05/09/2016  . Intermittent explosive disorder 04/27/2016  . Psychoses (HCC)   . Abscess of finger of left hand 05/19/2013    Past Surgical History:  Procedure Laterality Date  . CESAREAN SECTION       OB History   No obstetric history on file.     Family History  Problem Relation Age of Onset  . Hypertension Other   . Diabetes Other   . CAD Other     Social History   Tobacco Use  . Smoking status: Never Smoker  . Smokeless tobacco: Never Used  Substance Use Topics  . Alcohol use: Yes    Comment: occ  . Drug use: No    Home Medications Prior to Admission medications   Medication Sig Start Date End Date Taking? Authorizing Provider  lisinopril (ZESTRIL) 20 MG  tablet Take 20 mg by mouth daily.   Yes [provider]  metFORMIN (GLUCOPHAGE) 1000 MG tablet Take by mouth. 11/14/19  Yes [provider]  buPROPion (WELLBUTRIN XL) 150 MG 24 hr tablet Take 150 mg by mouth daily. 05/05/16 12/16/24  [provider]  hydrochlorothiazide (MICROZIDE) 12.5 MG capsule Take 1 capsule (12.5 mg total) by mouth daily. 11/23/18   Altamese Dilling, MD  Melatonin 5 MG CAPS Take 1 capsule (5 mg total) by mouth at bedtime. 03/25/20   Gailen Shelter, PA  phentermine 37.5 MG capsule Take 37.5 mg by mouth daily.    [provider]  traMADol (ULTRAM) 50 MG tablet Take 1 tablet (50 mg total) by mouth every 6 (six) hours as needed. 01/19/20 01/18/21  Joni Reining, PA-C    Allergies    Pineapple and Latex  Review of Systems   Review of Systems  Constitutional: Negative for fever.  Respiratory: Positive for cough.   Cardiovascular: Negative for chest pain.  Gastrointestinal: Negative for abdominal pain, nausea and vomiting.  Neurological: Negative for headaches.  Psychiatric/Behavioral: Positive for hallucinations and sleep disturbance. Negative for suicidal ideas. The patient is nervous/anxious.     Physical Exam Updated Vital Signs BP (!) 146/73   Pulse 86   Temp 98.2 F (36.8  C) (Oral)   Resp 16   Ht 5\' 2"  (1.575 m)   Wt 118.8 kg   LMP 03/04/2020   SpO2 96%   BMI 47.92 kg/m   Physical Exam Vitals and nursing note reviewed.  Constitutional:      General: She is not in acute distress.    Appearance: She is well-developed. She is not diaphoretic.  HENT:     Head: Normocephalic and atraumatic.  Eyes:     Conjunctiva/sclera: Conjunctivae normal.  Cardiovascular:     Rate and Rhythm: Normal rate and regular rhythm.  Pulmonary:     Effort: Pulmonary effort is normal. No respiratory distress.     Breath sounds: Normal breath sounds. No wheezing.  Abdominal:     General: There is no distension.     Palpations: Abdomen  is soft.     Tenderness: There is no abdominal tenderness. There is no guarding.  Musculoskeletal:        General: No tenderness.     Cervical back: Normal range of motion.  Skin:    General: Skin is warm and dry.     Findings: No erythema or rash.  Neurological:     Mental Status: She is alert and oriented to person, place, and time.  Psychiatric:        Attention and Perception: She perceives auditory hallucinations.        Mood and Affect: Mood is anxious.        Speech: Speech is rapid and pressured and tangential.        Behavior: Behavior is agitated and hyperactive.        Thought Content: Thought content is paranoid. Thought content does not include homicidal or suicidal ideation. Thought content does not include homicidal or suicidal plan.     ED Results / Procedures / Treatments   Labs (all labs ordered are listed, but only abnormal results are displayed) Labs Reviewed  COMPREHENSIVE METABOLIC PANEL - Abnormal; Notable for the following components:      Result Value   Sodium 134 (*)    Glucose, Bld 161 (*)    All other components within normal limits  RAPID URINE DRUG SCREEN, HOSP PERFORMED - Abnormal; Notable for the following components:   Tetrahydrocannabinol POSITIVE (*)    All other components within normal limits  CBC WITH DIFFERENTIAL/PLATELET - Abnormal; Notable for the following components:   MCV 78.2 (*)    All other components within normal limits  RESPIRATORY PANEL BY RT PCR (FLU A&B, COVID)  ETHANOL  PREGNANCY, URINE    EKG EKG Interpretation  Date/Time:  Sunday March 28 2020 13:33:29 EDT Ventricular Rate:  110 PR Interval:    QRS Duration: 90 QT Interval:  339 QTC Calculation: 459 R Axis:   68 Text Interpretation: Sinus tachycardia Low voltage, precordial leads No significant change since last tracing Confirmed by Gareth Morgan [phone]) on 03/28/2020 1:58:56 PM   Radiology No results found.  Procedures Procedures (including critical  care time)  Medications Ordered in ED Medications  acetaminophen (TYLENOL) tablet 650 mg (has no administration in time range)  zolpidem (AMBIEN) tablet 5 mg (has no administration in time range)  buPROPion (WELLBUTRIN XL) 24 hr tablet 150 mg (150 mg Oral Not Given 03/28/20 1939)  lisinopril (ZESTRIL) tablet 20 mg (20 mg Oral Given 03/28/20 1934)  metFORMIN (GLUCOPHAGE) tablet 1,000 mg (has no administration in time range)  haloperidol lactate (HALDOL) injection 5 mg (5 mg Intramuscular Given 03/28/20 1433)    ED  Course  I have reviewed the triage vital signs and the nursing notes.  Pertinent labs & imaging results that were available during my care of the patient were reviewed by me and considered in my medical decision making (see chart for details).    MDM Rules/Calculators/A&P                      39yo female with history of bipolar, schizophrenia, DM, htn, presents with concern for anxiety, not sleeping, desire for psychiatric admission.  Labs without significant findings. Doubt pneumonia. COVID testing negative. Pt medically cleared. Presentation consistent with mania with paranoid and psychotic features and feel she would benefit from inpatient admission. She is voluntary. TTS agrees that pt meets inpt criteria. Awaiting admission.    Final Clinical Impression(s) / ED Diagnoses Final diagnoses:  Mania [facility])    Rx / DC Orders ED Discharge Orders    None       Alvira Monday, MD 03/28/20 2202

## 2020-03-28 NOTE — ED Notes (Signed)
IVC paperwork completed and notarized. Faxed to [employer] office.

## 2020-03-28 NOTE — ED Notes (Signed)
Called the [employer] office and spoke to [employer] who verfied that he received pts faxed paperwork and will make contact as soon as paperwork is processed.

## 2020-03-28 NOTE — Progress Notes (Signed)
Patient meets criteria for inpatient treatment per Nanine Means, NP. No appropriate beds at [facility] currently. CSW faxed referrals to the following facilities for review:   [facility]   [facility]   [facility]   [facility]   [facility]   [facility]   [facility]   [facility]   [facility]   [facility]   [facility]   [facility]     TTS will continue to seek bed placement.     Ruthann Cancer MSW, [facility] Social Worker Disposition  [facility] Ph: [phone] Fax: [phone] 03/28/2020 4:23 PM

## 2020-03-28 NOTE — ED Notes (Signed)
Pt has changed her mind again and is now agreeable to psych admission. Pt's belongings secured per protocol. Pt changed into lavender scrubs. Pt wanded by security. Room secured from hazards.

## 2020-03-28 NOTE — Progress Notes (Signed)
Pt accepted to [facility]. Estill Cotta is the accepting provider.  Call report to [phone] Madelaine Bhat, RN @ [facility] ED notified.   Pt is  Voluntary Pt may be transported by [employer] Pt scheduled to arrive at the [employer] located at [address]  Pt can arrive on Monday 03/29/20 after 10am   Ruthann Cancer MSW, [facility] Clin[facility] Social Worker Disposition  [facility] Ph: [phone] Fax: [phone]  03/28/2020 4:47 PM
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
## 2020-03-28 NOTE — ED Notes (Signed)
Pt was provided microwave dinner. Pt ate 50% and is now sleeping. Even unlabored respirations noted.

## 2020-03-28 NOTE — ED Notes (Signed)
Pt is sleeping at this time.

## 2020-03-28 NOTE — ED Notes (Signed)
TTS in progress 

## 2020-03-28 NOTE — ED Notes (Signed)
Pt up to BR without difficulty
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
## 2020-03-28 NOTE — ED Notes (Signed)
Pt finally laying down in bed resting.

## 2020-03-29 LAB — CBG MONITORING, ED: Glucose-Capillary: 142 mg/dL — ABNORMAL HIGH (ref 70–99)

## 2020-03-29 NOTE — ED Notes (Signed)
Patient's belongings inventoried, medications counted and placed in pyxis. Purse, 2 cell phones and clothing placed in cabinet above respiratory.   Medications as follows: Bupropion 150MG  x23 tabs vraylar 3mg  x2 tabs bupriopion 150mg  x4 (label torn) Metformin 1000mg  x4.5 tabs Sulfameth/trimethoprim 800/160mg  x11 tabs Bupropion 150mg  x5 tabs Lisinopril-HCTZ 20/25mg  x83 tabs Melatonin 3mg  x13 tabs Oxybutynin ER 5mg  x16 tabs Blue and white tabs (unable to read label) x23 tabs Bottle of OTC melatonin tabs

## 2020-03-29 NOTE — Progress Notes (Signed)
CSW spoke with pt's RN. Pt's IVC paperwork will be faxed to [facility] at [phone].   Wells Guiles, LCSW, LCAS Disposition CSW [facility]/TTS [phone]

## 2020-03-29 NOTE — ED Notes (Signed)
IVC paperwork faxed to [facility] 

## 2020-03-29 NOTE — ED Notes (Signed)
IVC paperwork faxed to [facility] at this time. Pt is okay to go to [facility] while [employer].

## 2020-03-29 NOTE — ED Notes (Signed)
[facility] called to clarify that [facility] is aware of pt's involuntary status. Per Dannielle Huh at [facility], he will notify social work to make sure that [facility] is aware of IVC.

## 2020-03-29 NOTE — ED Notes (Signed)
Pt left ED with [facility]

## 2020-04-05 ENCOUNTER — Telehealth (HOSPITAL_BASED_OUTPATIENT_CLINIC_OR_DEPARTMENT_OTHER): Payer: Self-pay | Admitting: Emergency Medicine

## 2023-11-07 DIAGNOSIS — F411 Generalized anxiety disorder: Secondary | ICD-10-CM | POA: Diagnosis not present

## 2023-11-07 DIAGNOSIS — F29 Unspecified psychosis not due to a substance or known physiological condition: Secondary | ICD-10-CM | POA: Diagnosis not present

## 2023-11-23 DIAGNOSIS — F29 Unspecified psychosis not due to a substance or known physiological condition: Secondary | ICD-10-CM | POA: Diagnosis not present

## 2023-12-21 DIAGNOSIS — F29 Unspecified psychosis not due to a substance or known physiological condition: Secondary | ICD-10-CM | POA: Diagnosis not present

## 2024-01-04 DIAGNOSIS — I10 Essential (primary) hypertension: Secondary | ICD-10-CM | POA: Diagnosis not present

## 2024-01-04 DIAGNOSIS — M1711 Unilateral primary osteoarthritis, right knee: Secondary | ICD-10-CM | POA: Diagnosis not present

## 2024-01-04 DIAGNOSIS — Z23 Encounter for immunization: Secondary | ICD-10-CM | POA: Diagnosis not present

## 2024-01-04 DIAGNOSIS — E876 Hypokalemia: Secondary | ICD-10-CM | POA: Diagnosis not present

## 2024-01-04 DIAGNOSIS — E119 Type 2 diabetes mellitus without complications: Secondary | ICD-10-CM | POA: Diagnosis not present

## 2024-01-04 DIAGNOSIS — Z1231 Encounter for screening mammogram for malignant neoplasm of breast: Secondary | ICD-10-CM | POA: Diagnosis not present

## 2024-01-04 DIAGNOSIS — D649 Anemia, unspecified: Secondary | ICD-10-CM | POA: Diagnosis not present

## 2024-01-04 DIAGNOSIS — Z6841 Body Mass Index (BMI) 40.0 and over, adult: Secondary | ICD-10-CM | POA: Diagnosis not present

## 2024-01-04 DIAGNOSIS — E66813 Obesity, class 3: Secondary | ICD-10-CM | POA: Diagnosis not present
